# Patient Record
Sex: Female | Born: 1950 | Race: White | Hispanic: No | Marital: Married | Smoking: Never smoker
Health system: Southern US, Community
[De-identification: ages and names within clinical notes are randomized; demographics above are authoritative.]

## PROBLEM LIST (undated history)

## (undated) DIAGNOSIS — K738 Other chronic hepatitis, not elsewhere classified: Secondary | ICD-10-CM

## (undated) DIAGNOSIS — Z8781 Personal history of (healed) traumatic fracture: Secondary | ICD-10-CM

## (undated) DIAGNOSIS — D580 Hereditary spherocytosis: Secondary | ICD-10-CM

## (undated) DIAGNOSIS — K8020 Calculus of gallbladder without cholecystitis without obstruction: Secondary | ICD-10-CM

## (undated) DIAGNOSIS — K759 Inflammatory liver disease, unspecified: Secondary | ICD-10-CM

## (undated) DIAGNOSIS — S7291XA Unspecified fracture of right femur, initial encounter for closed fracture: Secondary | ICD-10-CM

## (undated) DIAGNOSIS — K115 Sialolithiasis: Secondary | ICD-10-CM

## (undated) HISTORY — PX: CHOLECYSTECTOMY: SHX55

## (undated) HISTORY — DX: Personal history of (healed) traumatic fracture: Z87.81

## (undated) HISTORY — DX: Unspecified fracture of right femur, initial encounter for closed fracture: S72.91XA

## (undated) HISTORY — DX: Sialolithiasis: K11.5

## (undated) HISTORY — PX: JOINT REPLACEMENT: SHX530

## (undated) HISTORY — DX: Inflammatory liver disease, unspecified: K75.9

## (undated) HISTORY — PX: OTHER SURGICAL HISTORY: SHX169

## (undated) HISTORY — PX: INTRAOPERATIVE CHOLANGIOGRAM: SHX5230

## (undated) HISTORY — DX: Hereditary spherocytosis: D58.0

## (undated) HISTORY — DX: Calculus of gallbladder without cholecystitis without obstruction: K80.20

## (undated) HISTORY — PX: SPHINCTEROTOMY: SHX5279

## (undated) HISTORY — DX: Other chronic hepatitis, not elsewhere classified: K73.8

---

## 2004-12-05 ENCOUNTER — Ambulatory Visit: Payer: Self-pay | Admitting: Physical Medicine & Rehabilitation

## 2004-12-05 ENCOUNTER — Inpatient Hospital Stay (HOSPITAL_COMMUNITY): Admission: RE | Admit: 2004-12-05 | Discharge: 2004-12-11 | Payer: Self-pay | Admitting: Orthopedic Surgery

## 2005-02-14 ENCOUNTER — Ambulatory Visit: Payer: Self-pay | Admitting: Family Medicine

## 2005-02-23 ENCOUNTER — Ambulatory Visit: Payer: Self-pay | Admitting: Family Medicine

## 2005-03-09 ENCOUNTER — Ambulatory Visit: Payer: Self-pay | Admitting: Family Medicine

## 2005-03-14 ENCOUNTER — Ambulatory Visit: Payer: Self-pay | Admitting: Family Medicine

## 2005-03-26 ENCOUNTER — Ambulatory Visit: Payer: Self-pay | Admitting: Family Medicine

## 2005-03-27 ENCOUNTER — Encounter: Admission: RE | Admit: 2005-03-27 | Discharge: 2005-05-13 | Payer: Self-pay | Admitting: Family Medicine

## 2005-10-23 ENCOUNTER — Ambulatory Visit: Payer: Self-pay | Admitting: Family Medicine

## 2005-11-20 ENCOUNTER — Ambulatory Visit: Payer: Self-pay | Admitting: Family Medicine

## 2005-12-04 ENCOUNTER — Ambulatory Visit: Payer: Self-pay | Admitting: Family Medicine

## 2006-01-01 ENCOUNTER — Ambulatory Visit: Payer: Self-pay | Admitting: Family Medicine

## 2006-01-29 ENCOUNTER — Ambulatory Visit: Payer: Self-pay | Admitting: Family Medicine

## 2006-02-19 DIAGNOSIS — E039 Hypothyroidism, unspecified: Secondary | ICD-10-CM

## 2006-02-19 DIAGNOSIS — E669 Obesity, unspecified: Secondary | ICD-10-CM | POA: Insufficient documentation

## 2006-02-19 DIAGNOSIS — D649 Anemia, unspecified: Secondary | ICD-10-CM | POA: Insufficient documentation

## 2006-02-19 DIAGNOSIS — E739 Lactose intolerance, unspecified: Secondary | ICD-10-CM

## 2006-02-19 DIAGNOSIS — N951 Menopausal and female climacteric states: Secondary | ICD-10-CM

## 2006-02-19 DIAGNOSIS — M810 Age-related osteoporosis without current pathological fracture: Secondary | ICD-10-CM | POA: Insufficient documentation

## 2006-02-19 DIAGNOSIS — K219 Gastro-esophageal reflux disease without esophagitis: Secondary | ICD-10-CM | POA: Insufficient documentation

## 2006-02-19 DIAGNOSIS — G43909 Migraine, unspecified, not intractable, without status migrainosus: Secondary | ICD-10-CM | POA: Insufficient documentation

## 2006-09-06 ENCOUNTER — Encounter: Payer: Self-pay | Admitting: Family Medicine

## 2006-09-27 ENCOUNTER — Ambulatory Visit: Payer: Self-pay | Admitting: Family Medicine

## 2006-09-27 DIAGNOSIS — B354 Tinea corporis: Secondary | ICD-10-CM | POA: Insufficient documentation

## 2006-09-27 DIAGNOSIS — R7301 Impaired fasting glucose: Secondary | ICD-10-CM | POA: Insufficient documentation

## 2006-09-27 DIAGNOSIS — J309 Allergic rhinitis, unspecified: Secondary | ICD-10-CM | POA: Insufficient documentation

## 2006-09-27 LAB — CONVERTED CEMR LAB

## 2006-10-03 ENCOUNTER — Encounter: Payer: Self-pay | Admitting: Family Medicine

## 2006-10-06 ENCOUNTER — Encounter: Payer: Self-pay | Admitting: Family Medicine

## 2006-10-24 ENCOUNTER — Ambulatory Visit: Payer: Self-pay | Admitting: Family Medicine

## 2006-10-24 DIAGNOSIS — R21 Rash and other nonspecific skin eruption: Secondary | ICD-10-CM

## 2006-10-30 ENCOUNTER — Telehealth: Payer: Self-pay | Admitting: Family Medicine

## 2006-11-04 ENCOUNTER — Ambulatory Visit: Payer: Self-pay | Admitting: Family Medicine

## 2006-11-04 DIAGNOSIS — L299 Pruritus, unspecified: Secondary | ICD-10-CM | POA: Insufficient documentation

## 2006-11-05 ENCOUNTER — Encounter: Payer: Self-pay | Admitting: Family Medicine

## 2006-11-05 LAB — CONVERTED CEMR LAB
Basophils Absolute: 0.1 10*3/uL (ref 0.0–0.1)
Basophils Relative: 1 % (ref 0–1)
Eosinophils Absolute: 0.4 10*3/uL (ref 0.0–0.7)
Eosinophils Relative: 3 % (ref 0–5)
Monocytes Relative: 17 % — ABNORMAL HIGH (ref 3–11)
Platelets: 599 10*3/uL — ABNORMAL HIGH (ref 150–400)
RBC: 3.18 M/uL — ABNORMAL LOW (ref 3.87–5.11)
WBC: 11.8 10*3/uL — ABNORMAL HIGH (ref 4.0–10.5)

## 2006-11-18 ENCOUNTER — Ambulatory Visit: Payer: Self-pay | Admitting: Family Medicine

## 2006-11-18 DIAGNOSIS — D599 Acquired hemolytic anemia, unspecified: Secondary | ICD-10-CM | POA: Insufficient documentation

## 2006-11-18 LAB — CONVERTED CEMR LAB: Hemoglobin: 9.4 g/dL

## 2006-11-25 ENCOUNTER — Telehealth: Payer: Self-pay | Admitting: Family Medicine

## 2006-12-15 ENCOUNTER — Encounter: Payer: Self-pay | Admitting: Family Medicine

## 2006-12-22 ENCOUNTER — Encounter: Payer: Self-pay | Admitting: Family Medicine

## 2006-12-23 ENCOUNTER — Telehealth: Payer: Self-pay | Admitting: Family Medicine

## 2006-12-25 ENCOUNTER — Encounter: Payer: Self-pay | Admitting: Family Medicine

## 2007-01-23 ENCOUNTER — Encounter: Payer: Self-pay | Admitting: Family Medicine

## 2007-02-13 ENCOUNTER — Encounter: Payer: Self-pay | Admitting: Family Medicine

## 2007-02-13 LAB — CONVERTED CEMR LAB
AST: 33 units/L
Alkaline Phosphatase: 126 units/L
Sodium: 135 meq/L

## 2007-02-18 ENCOUNTER — Encounter: Payer: Self-pay | Admitting: Family Medicine

## 2007-03-03 ENCOUNTER — Encounter: Payer: Self-pay | Admitting: Family Medicine

## 2007-07-13 HISTORY — PX: VERTEBROPLASTY: SHX113

## 2007-09-19 ENCOUNTER — Encounter: Payer: Self-pay | Admitting: Family Medicine

## 2007-11-01 ENCOUNTER — Encounter: Payer: Self-pay | Admitting: Family Medicine

## 2007-12-01 ENCOUNTER — Ambulatory Visit: Payer: Self-pay | Admitting: Family Medicine

## 2007-12-01 DIAGNOSIS — L290 Pruritus ani: Secondary | ICD-10-CM

## 2007-12-01 DIAGNOSIS — M818 Other osteoporosis without current pathological fracture: Secondary | ICD-10-CM | POA: Insufficient documentation

## 2007-12-04 ENCOUNTER — Encounter: Payer: Self-pay | Admitting: Family Medicine

## 2007-12-19 ENCOUNTER — Ambulatory Visit: Payer: Self-pay | Admitting: Family Medicine

## 2007-12-22 ENCOUNTER — Encounter: Payer: Self-pay | Admitting: Family Medicine

## 2007-12-23 LAB — CONVERTED CEMR LAB
Cholesterol: 105 mg/dL (ref 0–200)
Glucose, Bld: 107 mg/dL — ABNORMAL HIGH (ref 70–99)
HDL: 34 mg/dL — ABNORMAL LOW (ref >39)
Total CHOL/HDL Ratio: 3.1
Triglycerides: 178 mg/dL — ABNORMAL HIGH (ref <150)

## 2008-07-30 ENCOUNTER — Encounter: Payer: Self-pay | Admitting: Family Medicine

## 2009-11-30 ENCOUNTER — Ambulatory Visit: Payer: Self-pay | Admitting: Family Medicine

## 2009-11-30 DIAGNOSIS — J0100 Acute maxillary sinusitis, unspecified: Secondary | ICD-10-CM

## 2009-12-08 ENCOUNTER — Ambulatory Visit: Payer: Self-pay | Admitting: Family Medicine

## 2009-12-08 DIAGNOSIS — I959 Hypotension, unspecified: Secondary | ICD-10-CM | POA: Insufficient documentation

## 2009-12-30 ENCOUNTER — Ambulatory Visit: Payer: Self-pay | Admitting: Family Medicine

## 2009-12-30 DIAGNOSIS — B9789 Other viral agents as the cause of diseases classified elsewhere: Secondary | ICD-10-CM

## 2010-01-05 ENCOUNTER — Encounter: Payer: Self-pay | Admitting: Family Medicine

## 2010-01-06 ENCOUNTER — Encounter: Admission: RE | Admit: 2010-01-06 | Discharge: 2010-01-06 | Payer: Self-pay | Admitting: Orthopedic Surgery

## 2010-02-24 ENCOUNTER — Ambulatory Visit: Payer: Self-pay | Admitting: Family Medicine

## 2010-02-24 DIAGNOSIS — L989 Disorder of the skin and subcutaneous tissue, unspecified: Secondary | ICD-10-CM | POA: Insufficient documentation

## 2010-02-27 LAB — CONVERTED CEMR LAB
Albumin: 4.3 g/dL (ref 3.5–5.2)
Alkaline Phosphatase: 92 units/L (ref 39–117)
Chloride: 104 meq/L (ref 96–112)
Creatinine, Ser: 0.72 mg/dL (ref 0.40–1.20)
Eosinophils Absolute: 0.6 10*3/uL (ref 0.0–0.7)
HCT: 31 % — ABNORMAL LOW (ref 36.0–46.0)
Hemoglobin: 9.9 g/dL — ABNORMAL LOW (ref 12.0–15.0)
Lymphocytes Relative: 27 % (ref 12–46)
Lymphs Abs: 3.5 10*3/uL (ref 0.7–4.0)
MCHC: 31.9 g/dL (ref 30.0–36.0)
MCV: 90.6 fL (ref 78.0–100.0)
Monocytes Absolute: 1.9 10*3/uL — ABNORMAL HIGH (ref 0.1–1.0)
Neutro Abs: 6.7 10*3/uL (ref 1.7–7.7)
Neutrophils Relative %: 52 % (ref 43–77)
Platelets: 429 10*3/uL — ABNORMAL HIGH (ref 150–400)
Sodium: 140 meq/L (ref 135–145)
Total Bilirubin: 5.5 mg/dL — ABNORMAL HIGH (ref 0.3–1.2)

## 2010-03-02 ENCOUNTER — Ambulatory Visit: Payer: Self-pay | Admitting: Family Medicine

## 2010-03-02 DIAGNOSIS — E538 Deficiency of other specified B group vitamins: Secondary | ICD-10-CM

## 2010-03-07 ENCOUNTER — Encounter: Payer: Self-pay | Admitting: Family Medicine

## 2010-03-07 DIAGNOSIS — D510 Vitamin B12 deficiency anemia due to intrinsic factor deficiency: Secondary | ICD-10-CM

## 2010-03-16 ENCOUNTER — Ambulatory Visit: Payer: Self-pay | Admitting: Family Medicine

## 2010-03-16 DIAGNOSIS — J069 Acute upper respiratory infection, unspecified: Secondary | ICD-10-CM | POA: Insufficient documentation

## 2010-06-13 NOTE — Miscellaneous (Signed)
  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  

## 2010-06-13 NOTE — Assessment & Plan Note (Signed)
Summary: B12 shot - jr  Nurse Visit   Allergies: 1)  ! Sulfa 2)  ! * Daravette 3)  ! * Cetacaine  Medication Administration  Injection # 1:    Medication: Vit B12 1000 mcg    Diagnosis: ANEMIA, HEMOLYTIC (ICD-283.9)    Route: IM    Site: L deltoid    Exp Date: 09/2011    Lot #: [id number]    Patient tolerated injection without complications    Given by: [name] (March 02, 2010 10:22 AM)  Orders Added: 1)  Vit B12 1000 mcg [J3420] 2)  Admin of Therapeutic Inj  intramuscular or subcutaneous [96372]   Medication Administration  Injection # 1:    Medication: Vit B12 1000 mcg    Diagnosis: ANEMIA, HEMOLYTIC (ICD-283.9)    Route: IM    Site: L deltoid    Exp Date: 09/2011    Lot #: [id number]    Patient tolerated injection without complications    Given by: [name] (March 02, 2010 10:22 AM)  Orders Added: 1)  Vit B12 1000 mcg [J3420] 2)  Admin of Therapeutic Inj  intramuscular or subcutaneous [[id number]]

## 2010-06-13 NOTE — Assessment & Plan Note (Signed)
Summary: URI/ jaundice   Vital Signs:  Patient profile:   60 year old female Height:      65 inches Weight:      220 pounds BMI:     36.74 O2 Sat:      96 % on Room air Temp:     99.2 degrees F oral Pulse rate:   74 / minute BP sitting:   120 / 42  (left arm) Cuff size:   large  Vitals Entered By: [name] (March 16, 2010 10:04 AM)  O2 Flow:  Room air CC: Sinus and allergy issues. Productive cough x 4 days.    Primary Care Provider:  Seymour Bars, DO  CC:  Sinus and allergy issues. Productive cough x 4 days. Marland Kitchen  History of Present Illness: 60 yo WF presents for sore throat and postnasal drip x 3 days.  She has had more chest congestion and cough since yesterday.  Denies chest tightness or SOB.  Has clear rhinorrhea and mild sinus pressure.  Cough is a little productive.  No fevers, chills, GI upset or rash.  No sick contacts.  Had ear pressure and a little HA.  Denies malaise or bodyaches.    She has been taking Zyrtec but it doesn't seem to be helping.  Her cough is not keeping her up at night.  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  Everytime she gets sick, she gets jaundiced and has noticed that her eye have started to turn yellow.       Allergies: 1)  ! Sulfa 2)  ! * Daravette 3)  ! * Cetacaine  Past History:  Past Medical History: Reviewed history from 12/01/2007 and no changes required. bilirubin stones  recurrent hepatitis finished HRT in 2006, G0  gall stones  hgb baseline 10  R femur fracture  sialolithiasis  spherocytosis w/ hemolytic anemia severe osteoporosis vertebral compression fractures  Social History: Reviewed history from 02/19/2006 and no changes required. Retired, married to [place].  Has 2 stepchildren.  Nonsmoker.  Does not exersice.  Wants to lose wt.  Spends winters in [place].  Review of Systems      See HPI  Physical Exam  General:  alert, well-developed, well-nourished, and well-hydrated.   obese Head:  normocephalic and atraumatic.   Eyes:  mild scleral icterus, wears glasses Nose:  External nasal examination shows no deformity or inflammation. Nasal mucosa are pink and moist without lesions or exudates. Mouth:  pharynx pink and moist.   Neck:  no masses.   Lungs:  Normal respiratory effort, chest expands symmetrically. Lungs are clear to auscultation, no crackles or wheezes. dry cough Heart:  Normal rate and regular rhythm. S1 and S2 normal without gallop, murmur, click, rub or other extra sounds. Skin:  slight jaundice; no rash Cervical Nodes:  shotty submandibular LA   Impression & Recommendations:  Problem # 1:  VIRAL URI (ICD-465.9)  Instructed on symptomatic treatment. Call if symptoms persist or worsen.  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 OK to use OTC Robitussin.  Call if any problems. Leaving for winter in [place] in 2 wks.  Has a doctor there to f/u with.  Complete Medication List: 1)  Atenolol 50 Mg Tabs (Atenolol) .... Take 1/2 tablet daily by mouth for migraines 2)  Calcium-vitamin D 250-125 Mg-unit Tabs (Calcium carbonate-vitamin d) .... Take one tablet by mouth once  a day 3)  Folic Acid Tabs (Folic acid tabs) .... Take 2 tablets by mouth once a day 4)  Levothroid 200 Mcg Tabs (Levothyroxine sodium) .... Take one tablet by  mouth once aday 5)  Omeprazole 20 Mg Cpdr (Omeprazole) .Marland Kitchen.. 1 tab by mouth daily 6)  Accucheck Compact Plus Test Strips and Lancets  .... Check sugar once daily 7)  Reclast 5 Mg/147ml Soln (Zoledronic acid) .... Use as directed annually 8)  Vitamin D 2000 Iu  .... Take 1 tab by mouth once daily 9)  1st Choice Lancets Thin Misc (Lancets) .... Use daily as directed 10)  Accu-check Compact Plus Test Strips  .... Use once a wk as directed 11)  Tramadol Hcl 50 Mg Tabs (Tramadol hcl) .... Take 2 tabs by mouth three times a day as needed 12)   Bactroban 2 % Oint (Mupirocin) .... Apply to skin wound three times a day x 10 days 13)  Amoxicillin 875 Mg Tabs (Amoxicillin) .Marland Kitchen.. 1 tab by mouth two times a day  Patient Instructions: 1)  Take 10 days of Amoxicillin for respiratory infection. 2)  Supportive care 3)  Clear fluids. 4)  Robittussin as needed. Prescriptions: AMOXICILLIN 875 MG TABS (AMOXICILLIN) 1 tab by mouth two times a day  #20 x 0   Entered and Authorized by:   Seymour Bars DO   Signed by:   Seymour Bars DO on 03/16/2010   Method used:   Electronically to        [facility]* (retail)       [address] [place], [place]  [address]       Ph: [phone]       Fax: [phone]   RxID:   [id number]    Orders Added: 1)  Est. Patient Level III [84132]

## 2010-06-13 NOTE — Letter (Signed)
Summary: [facility]   Imported By: Lanelle Bal 01/19/2010 08:56:18  _____________________________________________________________________  External Attachment:    Type:   Image     Comment:   External Document

## 2010-06-13 NOTE — Assessment & Plan Note (Signed)
Summary: sinsutisi   Vital Signs:  Patient profile:   60 year old female Height:      65 inches Weight:      220 pounds BMI:     36.74 O2 Sat:      96 % on Room air Temp:     98.6 degrees F oral Pulse rate:   59 / minute BP sitting:   103 / 39  (left arm) Cuff size:   large  Vitals Entered By: [name] (November 30, 2009 11:31 AM)  O2 Flow:  Room air CC: HA x 3 days, L ear pain x 2 days and sinus pressure and drainage x 1 day.   Primary Care Provider:  Seymour Bars, DO  CC:  HA x 3 days and L ear pain x 2 days and sinus pressure and drainage x 1 day.Marland Kitchen  History of Present Illness: 60 yo WF presents with a HA x 4 days, L sinus pressure and L ear pain x 2 days.  She has moved to [facility] but is visiting her mom and daughter here.  She has had some runny nose and nasal congestion.  She took some Zyrtec and excedrin.  She has pressure behind her L eye.  Denies any fevers or chills.  Has a mild sore throat and a little dry cough.  Feels run down.    She has hereditary spherocytosis and every time she gets a bad infection/ fever, she gets jaundiced.  This has not yet happened.   Current Medications (verified): 1)  Atenolol 50 Mg Tabs (Atenolol) .... Take 1/2 Tablet Daily By Mouth For Migraines 2)  Calcium-Vitamin D 250-125 Mg-Unit Tabs (Calcium Carbonate-Vitamin D) .... Take One Tablet By Mouth Once A Day 3)  Folic Acid  Tabs (Folic Acid Tabs) .... Take 2 Tablets By Mouth Once A Day 4)  Levothroid 200 Mcg Tabs (Levothyroxine Sodium) .... Take One Tablet By  Mouth Once Aday 5)  Omeprazole 20 Mg Cpdr (Omeprazole) .Marland Kitchen.. 1 Tab By Mouth Daily 6)  Accucheck Compact Plus Test Strips and Lancets .... Check Sugar Once Daily 7)  Reclast 5 Mg/162ml  Soln (Zoledronic Acid) .... Use As Directed Annually 8)  Vitamin D Oct 09, 1998 Iu .... Take 1 Tab By Mouth Once Daily 9)  1st Choice Lancets Thin   Misc (Lancets) .... Use Daily As Directed 10)  Accu-Check Compact Plus Test Strips .... Use Once A Wk As  Directed 11)  Tramadol Hcl 50 Mg Tabs (Tramadol Hcl) .... Take 2 Tabs By Mouth Three Times A Day As Needed  Allergies (verified): 1)  ! Sulfa 2)  ! * Daravette 3)  ! * Cetacaine  Past History:  Past Medical History: Reviewed history from 12/01/2007 and no changes required. bilirubin stones  recurrent hepatitis finished HRT in 08-Oct-2004, G0  gall stones  hgb baseline 10  R femur fracture  sialolithiasis  spherocytosis w/ hemolytic anemia severe osteoporosis vertebral compression fractures  Past Surgical History: Reviewed history from 12/01/2007 and no changes required. 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 Brother- IBS, high TGs, father- AMI, Liver ca (died in Oct 08, 1992), mother- arthritis, migraines, DM, highTGs  Social History: Reviewed history from 02/19/2006 and no changes required.  Retired, married to [place].  Has 2 stepchildren.  Nonsmoker.  Does not exersice.  Wants to lose wt.  Spends winters in [place].  Review of Systems      See HPI  Physical Exam  General:  alert, well-developed, well-nourished, and well-hydrated.  obese Head:  normocephalic and atraumatic.  L maxillary sinus TTP Eyes:  sclera non icteric; conjunctiva clear Ears:  EACs patent; TMs translucent and gray with good cone of light and bony landmarks.  Nose:  nasal congestion present Mouth:  pharynx pink and moist.   Neck:  no masses.   Lungs:  Normal respiratory effort, chest expands symmetrically. Lungs are clear to auscultation, no crackles or wheezes. Heart:  normal rate, regular rhythm, and no murmur.   Extremities:  no LE edema Skin:  color normal.  no pallor or jaundice Cervical Nodes:  No  lymphadenopathy noted Psych:  good eye contact, not anxious appearing, and not depressed appearing.     Impression & Recommendations:  Problem # 1:  ACUTE MAXILLARY SINUSITIS (ICD-461.0) Likely the cause for her L sided Head pressure and L sided ear ache.  Will treat her iwth zithromax x 5 days.  Use Advil Cold and sinus for symptom relief.  Call if not improved in 1 wk.  RTC to recheck BP given low DBP in 59 wks.  Asymptomatic.  Drink plenty of fluids, rest for supportive care. The following medications were removed from the medication list:    Allegra-d 12 Hour 60-120 Mg Tb12 (Fexofenadine-pseudoephedrine) .Marland Kitchen... 1 tab by mouth two times a day as needed Her updated medication list for this problem includes:    Zithromax Z-pak 250 Mg Tabs (Azithromycin) .Marland Kitchen... 2 tabs by mouth x 1 day then 1 tab by mouth daily x 4 days  Complete Medication List: 1)  Atenolol 50 Mg Tabs (Atenolol) .... Take 1/2 tablet daily by mouth for migraines 2)  Calcium-vitamin D 250-125 Mg-unit Tabs (Calcium carbonate-vitamin d) .... Take one tablet by mouth once a day 3)  Folic Acid Tabs (Folic acid tabs) .... Take 2 tablets by mouth once a day 4)  Levothroid 200 Mcg Tabs (Levothyroxine sodium) .... Take one tablet by  mouth once aday 5)  Omeprazole 20 Mg Cpdr (Omeprazole) .Marland Kitchen.. 1 tab by mouth daily 6)  Accucheck Compact Plus Test Strips and Lancets  .... Check sugar once daily 7)  Reclast 5 Mg/179ml Soln (Zoledronic acid) .... Use as directed annually 8)  Vitamin D 2000 Iu  .... Take 1 tab by mouth once daily 9)  1st Choice Lancets Thin Misc (Lancets) .... Use daily as directed 10)  Accu-check Compact Plus Test Strips  .... Use once a wk as directed 11)  Tramadol Hcl 50 Mg Tabs (Tramadol hcl) .... Take 2 tabs by mouth three times a day as needed 12)  Zithromax Z-pak 250 Mg Tabs (Azithromycin) .... 2 tabs by mouth x 1 day then 1 tab by mouth daily x 4 days  Patient Instructions: 1)  Take 5 days of Zithromax for  sinusitis. 2)  Take Advil Cold and Sinus for symptom relief. 3)  Drink plenty of fluids, rest. 4)  Return to recheck BP in 59 wks. Prescriptions: ZITHROMAX Z-PAK 250 MG TABS (AZITHROMYCIN) 2 tabs by mouth x 1 day then 1 tab by mouth daily x 4 days  #1 pack x 0   Entered and Authorized by:   Seymour Bars DO   Signed by:   Seymour Bars DO on 11/30/2009   Method used:  Electronically to        [facility]* (retail)       [address] [place], [place]  [address]       Ph: [phone]       Fax: [phone]   RxID:   [id number]

## 2010-06-13 NOTE — Assessment & Plan Note (Signed)
Summary: stitch/ anemia   Vital Signs:  Patient profile:   60 year old female Height:      65 inches Weight:      218 pounds BMI:     36.41 O2 Sat:      94 % on Room air Pulse rate:   78 / minute BP sitting:   119 / 42  (left arm) Cuff size:   large  Vitals Entered By: [name] (February 24, 2010 2:59 PM)  O2 Flow:  Room air CC: ? Infected stitch on belly.    Primary Care Provider:  Seymour Bars, DO  CC:  ? Infected stitch on belly. Marland Kitchen  History of Present Illness: 60 yo WF presents for an irritated suture from an old surgical incision over her epigastrium.  For about a wk, it has looked red and is a little itchy but no painful.  Draining some clear/yellow fluid.  Denies bleeding.  Denies fevers or chills but has fatigue.    Current Medications (verified): 1)  Atenolol 50 Mg Tabs (Atenolol) .... Take 1/2 Tablet Daily By Mouth For Migraines 2)  Calcium-Vitamin D 250-125 Mg-Unit Tabs (Calcium Carbonate-Vitamin D) .... Take One Tablet By Mouth Once A Day 3)  Folic Acid  Tabs (Folic Acid Tabs) .... Take 2 Tablets By Mouth Once A Day 4)  Levothroid 200 Mcg Tabs (Levothyroxine Sodium) .... Take One Tablet By  Mouth Once Aday 5)  Omeprazole 20 Mg Cpdr (Omeprazole) .Marland Kitchen.. 1 Tab By Mouth Daily 6)  Accucheck Compact Plus Test Strips and Lancets .... Check Sugar Once Daily 7)  Reclast 5 Mg/136ml  Soln (Zoledronic Acid) .... Use As Directed Annually 8)  Vitamin D 2000 Iu .... Take 1 Tab By Mouth Once Daily 9)  1st Choice Lancets Thin   Misc (Lancets) .... Use Daily As Directed 10)  Accu-Check Compact Plus Test Strips .... Use Once A Wk As Directed 11)  Tramadol Hcl 50 Mg Tabs (Tramadol Hcl) .... Take 2 Tabs By Mouth Three Times A Day As Needed  Allergies (verified): 1)  ! Sulfa 2)  ! * Daravette 3)  ! * Cetacaine  Past History:  Past Medical History: Reviewed history from 12/01/2007 and no changes required. bilirubin stones  recurrent hepatitis finished HRT in 2006, G0  gall stones  hgb baseline 10  R femur fracture  sialolithiasis  spherocytosis w/ hemolytic anemia severe osteoporosis vertebral compression fractures  Past Surgical History: Reviewed history from 12/01/2007 and no changes required. 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 Retired, married to [place].  Has 2 stepchildren.  Nonsmoker.  Does not exersice.  Wants to lose wt.  Spends winters in [place].  Review of Systems      See HPI  Physical Exam  General:  alert, well-developed, well-nourished, well-hydrated, and overweight-appearing.   Head:  normocephalic and atraumatic.   Eyes:  sclera non icteric Mouth:  pharynx pink and moist.   Skin:  irritated subcutaneous suture over epigastrium with localized hyperemia, no visible drainage, bleeding or edema.  No tracking with sterile swab  pale appearing   Impression & Recommendations:  Problem # 1:  SKIN LESION (ICD-709.9) Skin irritation from subcutaneous suture that is coming to  the surface.  Localized hyperemia but not an abscess or cellulitis. Will treat iwth Hydrogen Peroxide 3 x a day with topical Bactroban ointment and keep covered during the day. Avoid scratching. Call if looking any worse.  Problem # 2:  ANEMIA, OTHER, UNSPECIFIED (ICD-285.9) Hgb 8.9 with hx of hemolytic anemia, baseline of 10.  DBP a little low.  REst and hydrate this w/e.  Get additional labs today and f/u results on Monday. Her updated medication list for this problem includes:    Folic Acid Tabs (Folic acid tabs) .Marland Kitchen... Take 2 tablets by mouth once a day  Orders: T-Iron [phone]) T-Iron Binding Capacity (TIBC) ([id number]) T-Vitamin B12  [phone]) Fingerstick (36416) Hgb ([address])  Complete Medication List: 1)  Atenolol 50 Mg Tabs (Atenolol) .... Take 1/2 tablet daily by mouth for migraines 2)  Calcium-vitamin D 250-125 Mg-unit Tabs (Calcium carbonate-vitamin d) .... Take one tablet by mouth once a day 3)  Folic Acid Tabs (Folic acid tabs) .... Take 2 tablets by mouth once a day 4)  Levothroid 200 Mcg Tabs (Levothyroxine sodium) .... Take one tablet by  mouth once aday 5)  Omeprazole 20 Mg Cpdr (Omeprazole) .Marland Kitchen.. 1 tab by mouth daily 6)  Accucheck Compact Plus Test Strips and Lancets  .... Check sugar once daily 7)  Reclast 5 Mg/118ml Soln (Zoledronic acid) .... Use as directed annually 8)  Vitamin D 2000 Iu  .... Take 1 tab by mouth once daily 9)  1st Choice Lancets Thin Misc (Lancets) .... Use daily as directed 10)  Accu-check Compact Plus Test Strips  .... Use once a wk as directed 11)  Tramadol Hcl 50 Mg Tabs (Tramadol hcl) .... Take 2 tabs by mouth three times a day as needed 12)  Bactroban 2 % Oint (Mupirocin) .... Apply to skin wound three times a day x 10 days  Other Orders: T-CBC w/Diff ([id number]) T-Comprehensive Metabolic Panel ([id number]) T-TSH ([id number])  Patient Instructions: 1)  Clean stitch redness with hydrogen peroxide on a clean Q - tip 3 x a day.  Cover with Bactroban OIntment and a bandage during the day.  Call if any spreading redness or fevers occurs. 2)  Labs downstairs today for Hgb 8.9 (10 is your baseline). 3)  Will call you w/ results on Monday. 4)  Hydrate and rest this weekend.  Prescriptions: BACTROBAN 2 % OINT (MUPIROCIN) apply to skin wound three times a day x 10 days  #22 g x 0   Entered and Authorized by:   Seymour Bars DO   Signed by:   Seymour Bars DO on 02/24/2010   Method used:   Electronically to        [facility]* (retail)       [address] [place], [place]  [address]       Ph: [phone]       Fax: [phone]   RxID:   [id number]   Laboratory  Results   Blood Tests   Date/Time Received: 02/24/10 Date/Time Reported: 02/25/99    CBC   HGB:  8.9 g/dL   (Normal Range: 23.7-62.8 in Males, 12.0-15.0 in Females)

## 2010-06-13 NOTE — Assessment & Plan Note (Signed)
Summary: viral infection   Vital Signs:  Patient profile:   60 year old female Height:      65 inches Weight:      223 pounds BMI:     37.24 O2 Sat:      97 % on Room air Temp:     98.7 degrees F oral Pulse rate:   76 / minute BP sitting:   127 / 50  (left arm) Cuff size:   large  Vitals Entered By: [name] (December 30, 2009 11:09 AM)  O2 Flow:  Room air CC: ST, HA and sores in mouth x this AM.   Primary Care [name]:  Seymour Bars, DO  CC:  ST and HA and sores in mouth x this AM..  History of Present Illness: 60 yo WF presents for a sore throat that she woke up with this morning.  Felt hot.  Has sores in her mouth today.  Has a dry cough.  No runny nose.  Denies GI upset or rash.  Denies sick contacts but she has been in and out of the hospital visiting her mom who has been ill.    Denies tea colored urine.  She has not taken anything for her symptoms today.    Allergies: 1)  ! Sulfa 2)  ! * Daravette 3)  ! * Cetacaine  Past History:  Past Medical History: Reviewed history from 12/01/2007 and no changes required. bilirubin stones  recurrent hepatitis finished HRT in 2006, G0  gall stones  hgb baseline 10  R femur fracture  sialolithiasis  spherocytosis w/ hemolytic anemia severe osteoporosis vertebral compression fractures  Past Surgical History: Reviewed history from 12/01/2007 and no changes required. 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 Retired, married to [place]i [place].  Has 2 stepchildren.  Nonsmoker.  Does not exersice.  Wants to lose wt.  Spends winters in [place].  Review of Systems      See HPI  Physical Exam  General:  alert, well-developed,  well-nourished, and well-hydrated.  obese Head:  normocephalic and atraumatic.   Eyes:  sclera non icteric Nose:  no nasal discharge.   Mouth:  pharynx pink and moist.  tiny vesicular sores inner lower lip Neck:  tender shotty anterior cervical chain LA Lungs:  Normal respiratory effort, chest expands symmetrically. Lungs are clear to auscultation, no crackles or wheezes. Heart:  Normal rate and regular rhythm. S1 and S2 normal without gallop, murmur, click, rub or other extra sounds. Abdomen:  soft.   Extremities:  no LE edema Skin:  no rash, jaundice or pallor Psych:  good eye contact, not anxious appearing, and not depressed appearing.     Impression & Recommendations:  Problem # 1:  VIRAL INFECTION, ACUTE (ICD-079.99)  Likley adenovirus or coxackie virus given sore throat, malaise and vesicular mouth sores. Rapid strep neg. Treat iwth supportive care + Dukes Magic Mouthwash. Due to her spherocytosis, I gave her RX for Zithromax to start IF she develops a fever, jaundice, tea colored urine (given her hx of hemolysis and severe anemia from infection).    Orders: Rapid Strep (63016)  Complete Medication List: 1)  Atenolol 50 Mg Tabs (Atenolol) .... Take 1/2 tablet daily by  mouth for migraines 2)  Calcium-vitamin D 250-125 Mg-unit Tabs (Calcium carbonate-vitamin d) .... Take one tablet by mouth once a day 3)  Folic Acid Tabs (Folic acid tabs) .... Take 2 tablets by mouth once a day 4)  Levothroid 200 Mcg Tabs (Levothyroxine sodium) .... Take one tablet by  mouth once aday 5)  Omeprazole 20 Mg Cpdr (Omeprazole) .Marland Kitchen.. 1 tab by mouth daily 6)  Accucheck Compact Plus Test Strips and Lancets  .... Check sugar once daily 7)  Reclast 5 Mg/154ml Soln (Zoledronic acid) .... Use as directed annually 8)  Vitamin D 2000 Iu  .... Take 1 tab by mouth once daily 9)  1st Choice Lancets Thin Misc (Lancets) .... Use daily as directed 10)  Accu-check Compact Plus Test Strips  .... Use once a wk as  directed 11)  Tramadol Hcl 50 Mg Tabs (Tramadol hcl) .... Take 2 tabs by mouth three times a day as needed 12)  Zithromax Z-pak 250 Mg Tabs (Azithromycin) .... 2 tabs by mouth x 1 day then 1 tab by mouth daily x 4 days 13)  Dukes Magic Mouthwash  .Marland Kitchen.. 10cc swish and gargle qid as needed sore throat pain  Patient Instructions: 1)  Use Duke's Magic Mouthwash as needed for mouth/ sore throat pain. 2)  Use Ibuprofen as needed for aches/ pains. 3)  Drink plenty of clear fluids, rest. 4)  Start Z-Pack if FEVER occurs.  Temp > 100.4. 5)  Call if any problems. Prescriptions: DUKES MAGIC MOUTHWASH 10cc swish and gargle QID as needed sore throat pain  #150 ml x 0   Entered and Authorized by:   Seymour Bars DO   Signed by:   Seymour Bars DO on 12/30/2009   Method used:   Printed then faxed to ...       [facility]* (retail)       [address] [place], [place]  [address]       Ph: [phone]       Fax: [phone]   RxID:   [id number] ZITHROMAX Z-PAK 250 MG TABS (AZITHROMYCIN) 2 tabs by mouth x 1 day then 1 tab by mouth daily x 4 days  #1 pack x 0   Entered and Authorized by:   Seymour Bars DO   Signed by:   Seymour Bars DO on 12/30/2009   Method used:   Electronically to        [facility]* (retail)       [address] [place], [place]  [address]       Ph: [phone]       Fax: [phone]   RxID:   [id number]

## 2010-06-13 NOTE — Assessment & Plan Note (Signed)
Summary: BP CHECK//VGJ  Nurse Visit   Vital Signs:  Patient profile:   60 year old female Pulse rate:   84 / minute BP sitting:   125 / 61  (left arm) Cuff size:   large  Vitals Entered By: Kathlene November (December 08, 2009 10:53 AM) CC: BP check CC: Follow-up HTN HPI: Taking Meds?no Side Effects?no Chest Pain, SOB, Dizziness?no A/P: HTN(401.1) At Goal? If no, MD will be notified. Follow-up in--  5 minutes was spent with the pt.   Allergies: 1)  ! Sulfa 2)  ! * Daravette 3)  ! * Cetacaine  Orders Added: 1)  Est. Patient Level I [62694]    Impression & Recommendations:  Problem # 1:  HYPOTENSION (ICD-458.9) Assessment Improved  Orders: Est. Patient Level I (85462)  Complete Medication List: 1)  Atenolol 50 Mg Tabs (Atenolol) .... Take 1/2 tablet daily by mouth for migraines 2)  Calcium-vitamin D 250-125 Mg-unit Tabs (Calcium carbonate-vitamin d) .... Take one tablet by mouth once a day 3)  Folic Acid Tabs (Folic acid tabs) .... Take 2 tablets by mouth once a day 4)  Levothroid 200 Mcg Tabs (Levothyroxine sodium) .... Take one tablet by  mouth once aday 5)  Omeprazole 20 Mg Cpdr (Omeprazole) .Marland Kitchen.. 1 tab by mouth daily 6)  Accucheck Compact Plus Test Strips and Lancets  .... Check sugar once daily 7)  Reclast 5 Mg/151ml Soln (Zoledronic acid) .... Use as directed annually 8)  Vitamin D 2000 Iu  .... Take 1 tab by mouth once daily 9)  1st Choice Lancets Thin Misc (Lancets) .... Use daily as directed 10)  Accu-check Compact Plus Test Strips  .... Use once a wk as directed 11)  Tramadol Hcl 50 Mg Tabs (Tramadol hcl) .... Take 2 tabs by mouth three times a day as needed 12)  Zithromax Z-pak 250 Mg Tabs (Azithromycin) .... 2 tabs by mouth x 1 day then 1 tab by mouth daily x 4 days

## 2010-09-29 NOTE — Op Note (Signed)
Kristi Bell, SOBOTTA                  ACCOUNT NO.:  [id number]   MEDICAL RECORD NO.:  [id number]          PATIENT TYPE:  INP   LOCATION:  0001                         FACILITY:  [facility]   PHYSICIAN:  Madlyn Frankel. Charlann Boxer, M.D.  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   PROCEDURE:  Conversion of previous right hip surgery to right total hip  replacement with autologous and allograft bone graft.   SURGEON:  Madlyn Frankel. Charlann Boxer, M.D.   ASSISTANT:  Jaquelyn Bitter. Chabon, P.A.   COMPONENTS USED:  I maintained the patient's previously placed femoral stem  as it was well fixed. I did change the ball to a 32 mm plus 5 Morris taper  ball. The acetabular cell was converted from the bipolar system to a 66 mm  deep profile pinnacle revision cup with two dome screws into the ileum. I  then used a 60 mm plus 4 __________ liner with 10 degree high wall. This  corresponded to the correct fit for the 66 cup.   ANESTHESIA:  General.   DRAINS:  Drains x1.   SPECIMENS:  None.   ESTIMATED BLOOD LOSS:  None.   COMPLICATIONS:  None.   INDICATIONS FOR PROCEDURE:  Kristi Bell is a 60 year old female who presented  to the office with increasing right groin pain. She was seen and evaluated  at that time and radiographs revealed that she had acetabular protrusio from  the previous right hip surgery performed 15 years ago. There was no reports  of any early complications, no problems with infections throughout the  course. She had been afebrile. This appeared to be an aseptic failure of  this hip system.   Please note that review of her previous medical records noted her  significant osteopenia. They had apparently tried to convert her from a  previous hip fracture fixation to a total hip replacement but were unable to  get screw purchase into her ileum and then converted her to a bipolar. This  information was obtained from the previous medical chart.   I did not have clinical suspicion for infection and thus did not do workup.  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   I reviewed with [name] the risks and benefits of the procedure of  converting her to a total hip replacement given the bone quality that was  noted previously, this was all taken in effect with possible failure of this  down the road as well as inability to provide fixation and having converted  her to even further procedure.   Once this was carried out and review of basic hip precautions as she had had  before had been reviewed, consent was obtained.   DESCRIPTION OF PROCEDURE:  The patient was brought to the operative theater.  Once adequate  anesthesia and preoperative antibiotics, 1 g of Ancef, were  administered, the patient was positioned in the left lateral decubitus  position, right side up. The right lower extremity was then prepped and  draped in a sterile fashion including a prescrub. The patient's previous  incision was noted to be a [place] type incision but it seemed to be too  far anterior. Given the fact that the incision was 15 years ago, I felt  there was adequate blood supply particularly in her hip to make an incision  more posterior then normal position. There was about a 4 cm gap between the  previous incision and my incision. Sharp dissection was carried down to the  iliotibial band, previous sutures were removed. The iliotibial band was  incised in line with the incision. Sharp dissection of scar tissue was  carried down to expose the hip. At this point, an extensive amount of time  was required in dislocating the hip ball. This included due to the protrusio  and overgrowth of  the rim of the bone using ostetomes to remove some of this  rim to allow for the ball to be remove. After some time, the hip was able to  be removed. I was able to flip it around to the posterior aspect of the hip  and remove the bipolar ball. At this point I then placed based on the amount  of wear and size of the ball that was already in place the anterior column  was so far anterior that it was very difficult to impossible to put the stem  anterior. For this reason, I placed it proximal and anterior along the ileum  after preparing this area. Retractors were placed allowing for exposure of  the acetabulum.   A large curette was then used to debride the pseudocapsule within the  acetabular segment. There was noted to be acetabular protrusio but the  posterior column and anterior column were intact to palpation with no pelvic  discontinuity. We then began reaming. Reaming was used purely to debride the  acetabulum and try to allow for at least an area of punctate bleeding. Her  posterior and anterior walls were noted to be very thin with good columns  present posterior superior and posterior inferior as well as the anterior  column. I reamed up to a 65 mm reamer at which point I got good rim bite. I  then was able to prepare the bed of the acetabulum better. There was  punctate bleeding but it all appeared of an endocortices type appearance.  Once this was prepared, the final 66 deep profile cup was brought onto the  field as I had previously trialed to see where the hip center would return  to. Cancellous bone from VIT[facility] was brought onto the field and mixed with  prepared blood product. This graft was then retrofilled back into the  acetabulum and the 66 cup impacted with good initial stretch fit. Based on  the bone quality present, two cancellous screws were placed, one in the superior ileum with an excellent purchase and one posterior superior which  could provide some rotary  stability. The rim screws I did not feel were  going to be adequate at least from the area exposed either for concern for  posterior nerve encroachment or the fact that there was very little to any  bone present. Following this, a trial liner was placed. Initially, I used a  20 degree high wall liner positioned at 11 o'clock.  I used a 32 zero ball  first but felt that the 32.5 would be better. The patient had an extensive  amount of scarring and limited hip flexion and internal rotation to begin  with and this made it difficult to assess her true range of motion.   A clinical decision was made at the time to provide as much lateralization  and potential range of motion by adding the offset liner and the plus 5  ball.   At this point, we tried to restore the hip center by bringing the cup out to  normal position and using a deep profile cup, we also used a plus 4, 10  degree high wall liner and a plus 5 ball. The hip had a combined anteversion  of about 40-45 degrees. With flexion and internal rotation,  she was only  able to tolerate about 30-40 degrees without some impingement but I do not  feel it is necessary at this point based on the amount of scar tissue  present and concern for destabilizing her to excise all the scar anteriorly.  She did not have any more room to provide any more anteversion of this  system based on the fact of impingement with external rotation. There was no  shuck indicating soft tissue tension was appropriate. Given all these trial  components, the final components were brought onto the field. The trial  liner was removed and the final 60 mm plus 4 offset 10 degree high wall  liner was impacted at about the 10 o'clock position followed by placement of  the 32 plus 5 Morris taper head onto the trunion of the previously placed  stem. The hip was then reduced and stable as a trial reduction. Based on the  lateralization, some of the soft tissue that I had  initially taken down from  the proximal femur were unable to be reapproximated but were retained. The  iliotibial band was then reapproximated using #1 Ethibond, the gluteus  maximus fascia reapproximated using a #1 Vicryl. The rest of the wound was  closed in layers with staples on the skin. The hip was then cleaned, dried  and dressed sterilely with Adaptic dressing, sponges, tape. The patient was  then extubated and transferred to the recovery room in stable condition.   Note that she did have poor venous access and the EJ line was initially  placed.  We will consult for a PICC line this afternoon or tomorrow. We will  followup her hematocrit as she is chronically anemic due to a severe  cytosis. We will try to maintain a hematocrit around 30 as she presented at  58 which she states to be normal for her.   Please note too with PT/OT, we will have her up and out of bed hopefully tomorrow. She will be touch down to partial weight bearing for a good six  weeks to allow the bone graft consolidation. Hip precautions will need to be  reviewed and met based on the level of impingement.       MDO/MEDQ  D:  12/05/2004  T:  12/05/2004  Job:  [id number]

## 2010-09-29 NOTE — Discharge Summary (Signed)
Kristi Bell, Kristi Bell                  ACCOUNT NO.:  [id number]   MEDICAL RECORD NO.:  [id number]          PATIENT TYPE:  INP   LOCATION:  1516                         FACILITY:  [facility]   PHYSICIAN:  Madlyn Frankel. Charlann Boxer, M.D.  DATE OF BIRTH:  February 04, 1951   DATE OF ADMISSION:  12/05/2004  DATE OF DISCHARGE:  12/11/2004                                 DISCHARGE SUMMARY   ADMISSION DIAGNOSES:  1.  Right hip pain secondary to failed right total hip arthroplasty.  2.  Hypothyroidism.  3.  Migraines.  4.  History of bronchitis.  5.  History of gastroesophageal reflux disease.   DISCHARGE DIAGNOSES:  1.  Right hip pain status post a right total hip arthroplasty revision.  2.  Hypothyroidism.  3.  Migraines.  4.  History of bronchitis.  5.  History of gastroesophageal reflux disease.   ALLERGIES:  Patient has allergy to SULFA, DARVOCET, and CETACAINE.   DISCHARGE MEDICATIONS:  1.  Coumadin per pharmacy protocol.  2.  Vicodin one to two tablets every four to six hours p.r.n. pain.  3.  Keflex 500 mg q.i.d. x7 days.  4.  Cipro 500 mg twice a day for three days.  5.  Robaxin 500 mg q.6h. p.r.n.  6.  Colace 100 mg t.i.d. p.r.n.  7.  Prilosec 40 mg p.o. daily.  8.  Synthroid 175 mcg p.o. daily.  9.  Folic acid daily.  10. Caltrate 600 Plus D one tablet p.o. daily.  11. Estradiol 2 mg p.o. daily.  12. Ursodiol 300 mg two tablets p.o. daily.   PROCEDURE:  Patient had a conversion to a previous right hip surgery to a  right total hip replacement autologous and Allograft bone graft.  Surgeon  was Durene Romans, M.D.  Assistant was [employer], P.A.-C.  Date of  procedure was December 05, 2004.  Anesthesia was general.  One drain.  Estimated  blood loss was minimal.  Complications were none.  Perioperative antibiotics  were given.   HOSPITAL COURSE:  Patient was admitted on July 25 for the above-stated  procedure which she tolerated well and after adequate time in the  postanesthesia care unit  she was transferred up to the regular floor.  Postoperative day #1 she was feeling quite well.  We were monitoring some  postoperative anemia.  Hemoglobin was 10.2.  We were to follow that.  Her  hemoglobin continued to drop on postoperative day two and she ended up  having to have 2 units of packed red blood cells infused on December 08, 2004.  Patient was evaluated for possible admit and physical therapy was  recommending home health physical therapy.  Patient did develop a fever of  102.4 during her stay but there was no sign of ongoing infection.  Her  urinalysis was negative even though she developed a leukocytosis of 24.7.  We did monitor that over a several day period which it did plateau at about  24 and began to diminish before discharge.  Patient continued with physical  therapy to the extent that she became weightbearing on that  right side.  Did  begin Keflex for empiric therapy.  No signs of erythema but she did have  some serosanguineous from her incision site of that hip.  Patient to be  discharged home on Keflex and Cipro and to follow up closely with Dr.  Durene Romans.   DISCHARGE PLANNING:  Patient to be discharged home on December 11, 2004.   FOLLOW-UP:  Patient to follow up with Dr. Durene Romans in 7-10 days.   CONDITION ON DISCHARGE:  Stable.   DIET:  Regular.   ACTIVITY:  Weightbearing as tolerated on the right hip.      Thomas B. Durwin Nora, P.A.      Madlyn Frankel Charlann Boxer, M.D.  Electronically Signed    TBD/MEDQ  D:  12/25/2004  T:  12/25/2004  Job:  [id number]

## 2010-09-29 NOTE — H&P (Signed)
[name], Bell                  ACCOUNT NO.:  [id number]   MEDICAL RECORD NO.:  [id number]           PATIENT TYPE:   LOCATION:                                 FACILITY:   PHYSICIAN:  Madlyn Frankel. Charlann Boxer, M.D.  DATE OF BIRTH:  May 17, 1950   DATE OF ADMISSION:  11/26/2004  DATE OF DISCHARGE:                                HISTORY & PHYSICAL   REASON FOR ADMISSION:  Kristi Bell is a 60 year old female complaining about  right hip pain.   HISTORY OF PRESENT ILLNESS:  The patient has a history of right total hip  arthroplasty in 1991, now is complaining about moderate pain especially with  ambulation. She is scheduled for a right hip revision by Dr. Madlyn Frankel. Charlann Boxer  on December 05, 2004.   ALLERGIES:  SULFA, DARVOCET, and CETACAINE throat spray.   MEDICATIONS:  1.  Actigall 300 mg 2 tablets daily.  2.  Actonel 30 mg 1 tablet weekly.  3.  Caltrate 600 plus D to 600 mg 2 daily.  4.  Estradiol 2 mg 1 tablet daily.  5.  Folic acid 2 mg 1 tablet daily.  6.  Prilosec 40 mg 1 tablet daily.  7.  Synthroid 175 mcg 1 tablet daily.   FAMILY HISTORY:  Coronary artery disease, osteoarthritis, and liver cancer.   PAST MEDICAL HISTORY:  1.  Significant for migraines.  2.  Bronchitis.  3.  GERD.  4.  UTI.  5.  Hypothyroidism.  6.  Spherocytosis anemia.   REVIEW OF SYSTEMS:  Painful ambulations but increasing recently.   PHYSICAL EXAMINATION:  VITAL SIGNS:  Pulse 80, respirations 20, blood  pressure 162/88.  GENERAL:  [name] is a healthy 60 year old female in no acute distress.  Pleasant in mood and affect. Alert and oriented x3.  HEENT:  Cranial nerves II through XII grossly intact.  NECK:  Full range of motion without any tenderness.  CHEST:  Active breath sounds bilaterally with no wheezes, rhonchi, or rales.  HEART:  Regular rate and rhythm with no murmur.  ABDOMEN:  Nontender and nondistended.  EXTREMITIES:  Moderate tenderness with her right hip especially with  internal and external  rotation. She does have some mild tenderness of the  left hip with internal and external rotation but the right is worse.  SKIN:  She has no skin rashes or signs of cellulitis or erythema. She has  mild pedal edema bilaterally.   LABORATORY DATA:  X-rays showed previous right total hip arthroplasty.   IMPRESSION:  Right hip pain secondary to failed right hip arthroplasty.   PLAN:  Right total hip revision by Dr. Madlyn Frankel. Charlann Boxer on December 05, 2004.      Thomas B. Durwin Nora, P.A.      Madlyn Frankel Charlann Boxer, M.D.  Electronically Signed    TBD/MEDQ  D:  11/26/2004  T:  11/26/2004  Job:  [id number]

## 2010-12-25 ENCOUNTER — Inpatient Hospital Stay (INDEPENDENT_AMBULATORY_CARE_PROVIDER_SITE_OTHER)
Admission: RE | Admit: 2010-12-25 | Discharge: 2010-12-25 | Disposition: A | Discharge status: Home / Self Care | Payer: Medicare Other | Source: Ambulatory Visit | Attending: Family Medicine | Admitting: Family Medicine

## 2010-12-25 ENCOUNTER — Encounter: Payer: Self-pay | Admitting: Family Medicine

## 2010-12-25 ENCOUNTER — Ambulatory Visit (INDEPENDENT_AMBULATORY_CARE_PROVIDER_SITE_OTHER): Payer: Medicare Other | Admitting: Family Medicine

## 2010-12-25 VITALS — BP 122/75 | HR 68

## 2010-12-25 DIAGNOSIS — R29818 Other symptoms and signs involving the nervous system: Secondary | ICD-10-CM

## 2010-12-25 DIAGNOSIS — M5430 Sciatica, unspecified side: Secondary | ICD-10-CM

## 2010-12-25 DIAGNOSIS — E538 Deficiency of other specified B group vitamins: Secondary | ICD-10-CM

## 2010-12-25 DIAGNOSIS — M62838 Other muscle spasm: Secondary | ICD-10-CM

## 2010-12-25 MED ORDER — CYANOCOBALAMIN 1000 MCG/ML IJ SOLN
1000.0000 ug | INTRAMUSCULAR | Status: DC
  Administered 2010-12-25: 1000 ug via INTRAMUSCULAR

## 2010-12-25 NOTE — Progress Notes (Signed)
  Subjective:    Patient ID: Kristi Bell, female    DOB: 1951-02-18, 60 y.o.   MRN: [id number]  HPI B12 inj given    Review of Systems     Objective:   Physical Exam        Assessment & Plan:

## 2011-01-26 ENCOUNTER — Ambulatory Visit: Payer: Medicare Other

## 2011-01-29 ENCOUNTER — Encounter: Payer: Self-pay | Admitting: Family Medicine

## 2011-01-29 DIAGNOSIS — K8050 Calculus of bile duct without cholangitis or cholecystitis without obstruction: Secondary | ICD-10-CM | POA: Insufficient documentation

## 2011-04-16 NOTE — Progress Notes (Signed)
Summary: Left leg pain (rm 2)   Vital Signs:  Patient Profile:   60 Years Old Female CC:      left glute and leg pain x 5 days Height:     65 inches Weight:      202 pounds O2 Sat:      98 % O2 treatment:    Room Air Temp:     98.6 degrees F oral Pulse rate:   71 / minute Resp:     16 per minute BP sitting:   120 / 74  (left arm) Cuff size:   large  Pt. in pain?   yes    Location:   left glute and left leg    Intensity:   8    Type:       sharp  Vitals Entered By: Lajean Saver RN (December 25, 2010 9:02 AM)                   Prior Medication List:  ATENOLOL 50 MG TABS (ATENOLOL) Take 1/2 tablet daily by mouth for migraines CALCIUM-VITAMIN D 250-125 MG-UNIT TABS (CALCIUM CARBONATE-VITAMIN D) Take one tablet by mouth once a day FOLIC ACID  TABS (FOLIC ACID TABS) Take 2 tablets by mouth once a day LEVOTHROID 200 MCG TABS (LEVOTHYROXINE SODIUM) Take one tablet by  mouth once aday OMEPRAZOLE 20 MG CPDR (OMEPRAZOLE) 1 tab by mouth daily * ACCUCHECK COMPACT PLUS TEST STRIPS AND LANCETS check sugar once daily RECLAST 5 MG/100ML  SOLN (ZOLEDRONIC ACID) USE AS DIRECTED ANNUALLY * VITAMIN D 2000 IU Take 1 tab by mouth once daily 1ST CHOICE LANCETS THIN   MISC (LANCETS) use daily as directed * ACCU-CHECK COMPACT PLUS TEST STRIPS use once a wk as directed TRAMADOL HCL 50 MG TABS (TRAMADOL HCL) Take 2 tabs by mouth three times a day as needed BACTROBAN 2 % OINT (MUPIROCIN) apply to skin wound three times a day x 10 days AMOXICILLIN 875 MG TABS (AMOXICILLIN) 1 tab by mouth two times a day   Updated Prior Medication List: ATENOLOL 50 MG TABS (ATENOLOL) Take 1/2 tablet daily by mouth for migraines CALCIUM-VITAMIN D 250-125 MG-UNIT TABS (CALCIUM CARBONATE-VITAMIN D) Take one tablet by mouth once a day FOLIC ACID  TABS (FOLIC ACID TABS) Take 2 tablets by mouth once a day LEVOTHROID 200 MCG TABS (LEVOTHYROXINE SODIUM) Take one tablet by  mouth once aday OMEPRAZOLE 20 MG CPDR  (OMEPRAZOLE) 1 tab by mouth daily * ACCUCHECK COMPACT PLUS TEST STRIPS AND LANCETS check sugar once daily RECLAST 5 MG/100ML  SOLN (ZOLEDRONIC ACID) USE AS DIRECTED ANNUALLY * VITAMIN D 2000 IU Take 1 tab by mouth once daily 1ST CHOICE LANCETS THIN   MISC (LANCETS) use daily as directed * ACCU-CHECK COMPACT PLUS TEST STRIPS use once a wk as directed TRAMADOL HCL 50 MG TABS (TRAMADOL HCL) Take 2 tabs by mouth three times a day as needed DILTIAZEM HCL 30 MG TABS (DILTIAZEM HCL) 1 three times a day ASPIRIN 81 MG TBEC (ASPIRIN) once daily  Current Allergies (reviewed today): ! SULFA ! * DARAVETTE ! * CETACAINEHistory of Present Illness Chief Complaint: left glute and leg pain x 5 days History of Present Illness: Patient w/a of L leg pain and siatica before. She statesWednesday before they left on Thursday to drive to [facility] started having a flare up and the 2 day trip did not help. Sh has had a R hip placement and has had several medical problems this year. States she has been  taken 1-2 5/500mg  vicodin w/out much pain relief. She has used neurotin for this before but not aware of the dosing.   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) B12 DEFICIENCY (ICD-266.2) SKIN LESION (ICD-709.9) VIRAL INFECTION, ACUTE (ICD-079.99) HYPOTENSION (ICD-458.9) ACUTE MAXILLARY SINUSITIS (ICD-461.0) PRURITUS, ANAL (ICD-698.0) HEALTHY ADULT FEMALE (ICD-V70.0) OTHER OSTEOPOROSIS (ICD-733.09) SCREENING FOR LIPOID DISORDERS (ICD-V77.91) ANEMIA, HEMOLYTIC (ICD-283.9) PRURITUS (ICD-698.9) SKIN RASH (ICD-782.1) IMPAIRED FASTING GLUCOSE (ICD-790.21) ALLERGIC RHINITIS (ICD-477.9) TINEA CORPORIS (ICD-110.5) OSTEOPOROSIS, UNSPECIFIED (ICD-733.00) OBESITY, NOS (ICD-278.00) MIGRAINE, UNSPEC., W/O INTRACTABLE MIGRAINE (ICD-346.90) MENOPAUSAL SYNDROME (ICD-627.2) HYPOTHYROIDISM, UNSPECIFIED  (ICD-244.9) GLUCOSE/LACTOSE INTOLERANCE (ICD-271.3) GASTROESOPHAGEAL REFLUX, NO ESOPHAGITIS (ICD-530.81) ANEMIA, OTHER, UNSPECIFIED (ICD-285.9)   Current Meds ATENOLOL 50 MG TABS (ATENOLOL) Take 1/2 tablet daily by mouth for migraines CALCIUM-VITAMIN D 250-125 MG-UNIT TABS (CALCIUM CARBONATE-VITAMIN D) Take one tablet by mouth once a day FOLIC ACID  TABS (FOLIC ACID TABS) Take 2 tablets by mouth once a day LEVOTHROID 200 MCG TABS (LEVOTHYROXINE SODIUM) Take one tablet by  mouth once aday OMEPRAZOLE 20 MG CPDR (OMEPRAZOLE) 1 tab by mouth daily * ACCUCHECK COMPACT PLUS TEST STRIPS AND LANCETS check sugar once daily RECLAST 5 MG/100ML  SOLN (ZOLEDRONIC ACID) USE AS DIRECTED ANNUALLY * VITAMIN D 2000 IU Take 1 tab by mouth once daily 1ST CHOICE LANCETS THIN   MISC (LANCETS) use daily as directed * ACCU-CHECK COMPACT PLUS TEST STRIPS use once a wk as directed TRAMADOL HCL 50 MG TABS (TRAMADOL HCL) Take 2 tabs by mouth three times a day as needed DILTIAZEM HCL 30 MG TABS (DILTIAZEM HCL) 1 three times a day ASPIRIN 81 MG TBEC (ASPIRIN) once daily MOBIC 7.5 MG TABS (MELOXICAM) 1 by mouth q day SKELAXIN 800 MG TABS (METAXALONE) 1 by mouth q 8hrs to q 12 as needed muscle relaxant NEURONTIN 300 MG CAPS (GABAPENTIN) 1 by mouth q day at night may increase to twice a day if needed PERCOCET 5-325 MG TABS (OXYCODONE-ACETAMINOPHEN) 1 by mouth q 8hrs prn  REVIEW OF SYSTEMS Constitutional Symptoms      Denies fever, chills, night sweats, weight loss, weight gain, and fatigue.  Eyes       Complains of glasses.      Denies change in vision, eye pain, eye discharge, contact lenses, and eye surgery. Ear/Nose/Throat/Mouth       Denies hearing loss/aids, change in hearing, ear pain, ear discharge, dizziness, frequent runny nose, frequent nose bleeds, sinus problems, sore throat, hoarseness, and tooth pain or bleeding.  Respiratory       Denies dry cough, productive cough, wheezing, shortness of breath,  asthma, bronchitis, and emphysema/COPD.  Cardiovascular       Denies murmurs, chest pain, and tires easily with exhertion.    Gastrointestinal       Denies stomach pain, nausea/vomiting, diarrhea, constipation, blood in bowel movements, and indigestion. Genitourniary       Denies painful urination, blood or discharge from vagina, kidney stones, and loss of urinary control. Neurological       Denies paralysis, seizures, and fainting/blackouts. Musculoskeletal       Complains of muscle pain, joint pain, joint stiffness, and decreased range of motion.      Denies redness, swelling, muscle weakness, and gout.      Comments: left leg/glute Skin       Denies bruising, unusual mles/lumps or sores, and hair/skin or nail changes.  Psych       Denies mood changes, temper/anger issues, anxiety/stress, speech problems,  depression, and sleep problems. Other Comments: Patient c/o pain in her left glute that radiates down her left leg. She has a hx of sciatica and says this feels very similiar. Pain is worse with getting up and down   Past History:  Family History: Last updated: 02/19/2006 Brother- IBS, high TGs, father- AMI, Liver ca (died in Oct 03, 1992), mother- arthritis, migraines, DM, highTGs  Social History: Last updated: 02/19/2006 Retired, married to [place].  Has 2 stepchildren.  Nonsmoker.  Does not exersice.  Wants to lose wt.  Spends winters in [place].  Past Medical History: Reviewed history from 12/01/2007 and no changes required. bilirubin stones  recurrent hepatitis finished HRT in 10-03-04, G0  gall stones  hgb baseline 10  R femur fracture  sialolithiasis  spherocytosis w/ hemolytic anemia severe osteoporosis vertebral compression fractures  Past Surgical History: Reviewed history from 12/01/2007 and no changes required. cholangiogram,  Cholecystectomy  Hysterectomy & BSO, non- cancerous  L arm pinned  R elbow pinned  R femur pinned  R hip pinned  R hip replacement  R hip  revision  sphincterotomy- for gall stones  Splenectomy bil duct surgery 8-08 vertebroplasty 3-09  Family History: Reviewed history from 02/19/2006 and no changes required. Brother- IBS, high TGs, father- AMI, Liver ca (died in 10/03/92), mother- arthritis, migraines, DM, highTGs  Social History: Reviewed history from 02/19/2006 and no changes required. Retired, married to [place].  Has 2 stepchildren.  Nonsmoker.  Does not exersice.  Wants to lose wt.  Spends winters in [place]. 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) B12 DEFICIENCY (ICD-266.2) SKIN LESION (ICD-709.9) VIRAL INFECTION, ACUTE (ICD-079.99) HYPOTENSION (ICD-458.9) ACUTE MAXILLARY SINUSITIS (ICD-461.0) PRURITUS, ANAL (ICD-698.0) HEALTHY ADULT FEMALE (ICD-V70.0) OTHER OSTEOPOROSIS (ICD-733.09) SCREENING FOR LIPOID DISORDERS (ICD-V77.91) ANEMIA, HEMOLYTIC (ICD-283.9) PRURITUS (ICD-698.9) SKIN RASH (ICD-782.1) IMPAIRED FASTING GLUCOSE (ICD-790.21) ALLERGIC RHINITIS (ICD-477.9) TINEA CORPORIS (ICD-110.5) OSTEOPOROSIS, UNSPECIFIED (ICD-733.00) OBESITY, NOS (ICD-278.00) MIGRAINE, UNSPEC., W/O INTRACTABLE MIGRAINE (ICD-346.90) MENOPAUSAL SYNDROME (ICD-627.2) HYPOTHYROIDISM, UNSPECIFIED (ICD-244.9) GLUCOSE/LACTOSE INTOLERANCE (ICD-271.3) GASTROESOPHAGEAL REFLUX, NO ESOPHAGITIS (ICD-530.81) ANEMIA, OTHER, UNSPECIFIED (ICD-285.9) New Problems: SCIATICA, 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  Plan New Medications/Changes: PERCOCET 5-325 MG TABS (OXYCODONE-ACETAMINOPHEN) 1 by mouth q  8hrs prn  #20 x 0, 12/25/2010, Hassan Rowan MD NEURONTIN 300 MG CAPS (GABAPENTIN) 1 by mouth q day at night may increase to twice a day if needed  #30 x 0, 12/25/2010, Hassan Rowan MD SKELAXIN 800 MG TABS (METAXALONE) 1 by mouth q 8hrs to q 12 as needed muscle relaxant  #30 x 1, 12/25/2010, Hassan Rowan MD MOBIC 7.5 MG TABS (MELOXICAM) 1 by mouth q day  #30 x 0, 12/25/2010, Hassan Rowan MD  New Orders: New Patient Level III [99203] Ketorolac-Toradol 15mg  [E4540] Admin of Therapeutic Inj  intramuscular or subcutaneous [96372] Follow Up: Follow up in 2-3 days if no improvement, Follow up with Primary Physician  The patient and/or caregiver has been counseled thoroughly with regard to medications prescribed including dosage, schedule, interactions, rationale for use, and possible side effects and they verbalize understanding.  Diagnoses and expected course of recovery discussed and will return if not improved as expected or if the condition worsens. Patient and/or caregiver verbalized understanding.  Prescriptions: PERCOCET 5-325  MG TABS (OXYCODONE-ACETAMINOPHEN) 1 by mouth q 8hrs prn  #20 x 0   Entered and Authorized by:   Hassan Rowan MD   Signed by:   Hassan Rowan MD on 12/25/2010   Method used:   Printed then faxed to ...       [facility]* (retail)       [address] [place], [place]  [address]       Ph: [phone]       Fax: [phone]   RxID:   [id number] NEURONTIN 300 MG CAPS (GABAPENTIN) 1 by mouth q day at night may increase to twice a day if needed  #30 x 0   Entered and Authorized by:   Hassan Rowan MD   Signed by:   Hassan Rowan MD on 12/25/2010   Method used:   Printed then faxed to ...       [facility]* (retail)       [address] [place], [place]  [address]       Ph: [phone]       Fax: [phone]   RxID:   [id number] SKELAXIN 800 MG TABS (METAXALONE) 1 by mouth q 8hrs to q 12 as needed muscle relaxant  #30 x 1   Entered and Authorized by:    Hassan Rowan MD   Signed by:   Hassan Rowan MD on 12/25/2010   Method used:   Printed then faxed to ...       [facility]* (retail)       [address] [place], [place]  [address]       Ph: [phone]       Fax: [phone]   RxID:   [id number] MOBIC 7.5 MG TABS (MELOXICAM) 1 by mouth q day  #30 x 0   Entered and Authorized by:   Hassan Rowan MD   Signed by:   Hassan Rowan MD on 12/25/2010   Method used:   Printed then faxed to ...       [facility]* (retail)       [address] [place], [place]  [address]       Ph: [phone]       Fax: [phone]   RxID:   [id number]   Patient Instructions: 1)  Please schedule a follow-up appointment as needed. 2)  Please schedule an appointment with your primary doctor in :3-5 dayus if not better 3)  Most patients (90%) with low back pain will improve with time (2-6 weeks). Keep active but avoid activities that are painful. Apply moist heat and/or ice to lower back several times a day.  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

## 2011-10-16 ENCOUNTER — Ambulatory Visit (INDEPENDENT_AMBULATORY_CARE_PROVIDER_SITE_OTHER): Payer: Medicare Other | Admitting: Family Medicine

## 2011-10-16 VITALS — Ht 65.0 in

## 2011-10-16 DIAGNOSIS — D599 Acquired hemolytic anemia, unspecified: Secondary | ICD-10-CM

## 2011-10-16 MED ORDER — CYANOCOBALAMIN 1000 MCG/ML IJ SOLN
1000.0000 ug | Freq: Once | INTRAMUSCULAR | Status: AC
  Administered 2011-10-16: 1000 ug via INTRAMUSCULAR

## 2011-10-16 NOTE — Patient Instructions (Signed)
none

## 2011-10-16 NOTE — Progress Notes (Signed)
  Subjective:    Patient ID: Kristi Bell, female    DOB: 1950-06-10, 61 y.o.   MRN: [id number] B12 injection. Pt will schedule OV HPI    Review of Systems     Objective:   Physical Exam        Assessment & Plan:  Patient not seen today

## 2011-10-18 ENCOUNTER — Encounter: Payer: Self-pay | Admitting: *Deleted

## 2011-10-22 ENCOUNTER — Encounter: Payer: Self-pay | Admitting: Physician Assistant

## 2011-10-22 ENCOUNTER — Ambulatory Visit (INDEPENDENT_AMBULATORY_CARE_PROVIDER_SITE_OTHER): Payer: Medicare Other | Admitting: Physician Assistant

## 2011-10-22 VITALS — BP 117/49 | HR 73 | Ht 64.0 in | Wt 205.0 lb

## 2011-10-22 DIAGNOSIS — Z23 Encounter for immunization: Secondary | ICD-10-CM

## 2011-10-22 DIAGNOSIS — E538 Deficiency of other specified B group vitamins: Secondary | ICD-10-CM

## 2011-10-22 DIAGNOSIS — Z79899 Other long term (current) drug therapy: Secondary | ICD-10-CM

## 2011-10-22 MED ORDER — AMBULATORY NON FORMULARY MEDICATION: Status: DC

## 2011-10-22 NOTE — Patient Instructions (Addendum)
Will get b12 labs today and call with results. Continue monthly B12 shots until we determine status of B12. Gave rx for Zostavax.

## 2011-10-22 NOTE — Progress Notes (Signed)
  Subjective:    Patient ID: Fran Lowes, female    DOB: 09/22/1950, 61 y.o.   MRN: [id number]  HPI Patient presents to the clinic to followup on thiamine B12 deficiency. She came in last week and got her B12 shot. She has not have labs drawn here in almost a year. patient lives in [place] for 8 months out of the year and only lived here for the summers. She has a PCP in the [place] that manages her ongoing health care needs and regular blood work. Her last blood work was done on May 23 by Dr. Chestine Spore. She uses this just to maintain her B12 shots and to be here if she needs [place] for acute illness. To note she did have a new diagnosis of hemochromatosis which she has started Exjade for management. She is having a upcoming procedure to reevaluate her ongoing problem with biliary stones.   She reports to be doing and feeling pretty well today with no immediate concerns. She wants to continue with monthly B12 shots.  We did discuss her need for shingles vaccine and patient wants prescription to get a pharmacy.   Review of Systems     Objective:   Physical Exam  Constitutional: She is oriented to person, place, and time. She appears well-developed and well-nourished.  HENT:  Head: Normocephalic and atraumatic.  Cardiovascular: Normal rate, regular rhythm, normal heart sounds and intact distal pulses.   Pulmonary/Chest: Effort normal and breath sounds normal. She has no wheezes.  Neurological: She is alert and oriented to person, place, and time.  Skin: Skin is warm and dry.  Psychiatric: She has a normal mood and affect. Her behavior is normal.          Assessment &plan:    Vitamin b12 def- Will continue to give monthly b12 shots as long as lab work supports. Will get b12 level today and call with results. As long as not too high will get b12 shot in 3 weeks.   Need for vaccine- Gave rx for Zostavax.

## 2011-11-12 ENCOUNTER — Encounter: Payer: Self-pay | Admitting: Physician Assistant

## 2011-11-12 ENCOUNTER — Ambulatory Visit: Payer: Medicare Other

## 2011-11-12 ENCOUNTER — Ambulatory Visit (INDEPENDENT_AMBULATORY_CARE_PROVIDER_SITE_OTHER): Payer: Medicare Other | Admitting: Physician Assistant

## 2011-11-12 VITALS — BP 106/46 | HR 70 | Temp 98.7°F | Ht 64.0 in | Wt 211.0 lb

## 2011-11-12 DIAGNOSIS — R031 Nonspecific low blood-pressure reading: Secondary | ICD-10-CM

## 2011-11-12 DIAGNOSIS — J329 Chronic sinusitis, unspecified: Secondary | ICD-10-CM

## 2011-11-12 MED ORDER — AMOXICILLIN-POT CLAVULANATE 875-125 MG PO TABS: 1.0000 | ORAL_TABLET | Freq: Two times a day (BID) | ORAL | Status: AC

## 2011-11-12 NOTE — Patient Instructions (Addendum)
Atenolol cut in half follow up with nurse blood pressure check in 2 weeks.   Augmentin twice a day for 10 days. Consider one day of decongestant to help relieve pressure and continue with BC powders for headache.

## 2011-11-13 NOTE — Progress Notes (Signed)
  Subjective:    Patient ID: Kristi Bell, female    DOB: 01-09-1951, 61 y.o.   MRN: [id number]  Sinusitis This is a new problem. The current episode started in the past 7 days. The problem has been gradually worsening since onset. There has been no fever. Her pain is at a severity of 6/10. The pain is moderate. Associated symptoms include congestion, coughing, headaches and sinus pressure. Pertinent negatives include no chills, diaphoresis, ear pain, hoarse voice, neck pain, shortness of breath, sneezing, sore throat or swollen glands. Past treatments include acetaminophen (Nyquil, Tylenol cold and sinus). The treatment provided no relief.   Has felt lightheaded lately but contributed it to sinus pressure and discomfort. She has checked her blood pressure a few times and it has been running low espeically on the bottom sometimes in the low 40's.    Review of Systems  Constitutional: Negative for chills and diaphoresis.  HENT: Positive for congestion and sinus pressure. Negative for ear pain, sore throat, hoarse voice, sneezing and neck pain.   Respiratory: Positive for cough. Negative for shortness of breath.   Neurological: Positive for headaches.       Objective:   Physical Exam  Constitutional: She is oriented to person, place, and time. She appears well-developed and well-nourished.  HENT:  Head: Normocephalic and atraumatic.  Right Ear: External ear normal.  Left Ear: External ear normal.  Nose: Nose normal.  Mouth/Throat: No oropharyngeal exudate.       TM's normal bilaterally. Left sided maxillary tenderness to palpation. Oropharynx is erythematous.  Eyes: Conjunctivae are normal.  Neck: Normal range of motion. Neck supple.       Anterior superficial enlarged lymph nodes bilaterally.  Cardiovascular: Normal rate, regular rhythm, normal heart sounds and intact distal pulses.   Pulmonary/Chest: Effort normal and breath sounds normal. She has no wheezes.  Lymphadenopathy:    She  has cervical adenopathy.  Neurological: She is alert and oriented to person, place, and time.  Skin: Skin is warm and dry.  Psychiatric: She has a normal mood and affect. Her behavior is normal.          Assessment & Plan:  Low blood pressure reading- will cut atenolol in half and take that daily. She is on beta blocker for migraine prevention. Hopefully this will increase bp a little without affecting migraine prevention. Will recheck with nurse visit in 2 weeks.  Sinusitis-Treated with amoxicillin for 10 days. Consider decongestant for one day to help relieve pressure. Stay hydrated. If not improving call office.

## 2011-11-16 ENCOUNTER — Ambulatory Visit (INDEPENDENT_AMBULATORY_CARE_PROVIDER_SITE_OTHER): Payer: Medicare Other | Admitting: Physician Assistant

## 2011-11-16 VITALS — BP 117/63 | HR 71

## 2011-11-16 DIAGNOSIS — E538 Deficiency of other specified B group vitamins: Secondary | ICD-10-CM

## 2011-11-16 MED ORDER — CYANOCOBALAMIN 1000 MCG/ML IJ SOLN
1000.0000 ug | Freq: Once | INTRAMUSCULAR | Status: AC
  Administered 2011-11-16: 1000 ug via INTRAMUSCULAR

## 2011-11-16 NOTE — Progress Notes (Signed)
  Subjective:    Patient ID: Kristi Bell, female    DOB: 1951/03/01, 61 y.o.   MRN: [id number] B12 injection. HPI    Review of Systems     Objective:   Physical Exam        Assessment & Plan:

## 2011-11-26 ENCOUNTER — Ambulatory Visit (INDEPENDENT_AMBULATORY_CARE_PROVIDER_SITE_OTHER): Payer: Medicare Other | Admitting: Physician Assistant

## 2011-11-26 VITALS — BP 122/58 | HR 75

## 2011-11-26 DIAGNOSIS — R031 Nonspecific low blood-pressure reading: Secondary | ICD-10-CM

## 2011-11-26 NOTE — Progress Notes (Signed)
  Subjective:    Patient ID: Kristi Bell, female    DOB: 12-08-1950, 61 y.o.   MRN: [id number]  HPI    Review of Systems     Objective:   Physical Exam        Assessment & Plan:  Pt notified of instructions. KG LPN

## 2011-11-26 NOTE — Patient Instructions (Signed)
BP looks great. Continue with 1/2 atenolol. Follow up as needed.

## 2011-11-26 NOTE — Progress Notes (Signed)
  Subjective:    Patient ID: Kristi Bell, female    DOB: 1951-02-07, 61 y.o.   MRN: [id number] Recheck BP- low at last visit. Pt is taking 1/2 tab of the Atenolol as directed. HPI    Review of Systems     Objective:   Physical Exam        Assessment & Plan:  Blood pressure looks great. Stay with 1/2 atenolol daily. Follow up as needed. Tandy Gaw PA-C

## 2011-12-13 ENCOUNTER — Ambulatory Visit (INDEPENDENT_AMBULATORY_CARE_PROVIDER_SITE_OTHER): Payer: Medicare Other | Admitting: Family Medicine

## 2011-12-13 ENCOUNTER — Encounter: Payer: Self-pay | Admitting: Family Medicine

## 2011-12-13 VITALS — BP 117/53 | HR 73 | Wt 213.0 lb

## 2011-12-13 DIAGNOSIS — M25569 Pain in unspecified knee: Secondary | ICD-10-CM

## 2011-12-13 DIAGNOSIS — M25561 Pain in right knee: Secondary | ICD-10-CM

## 2011-12-13 DIAGNOSIS — D580 Hereditary spherocytosis: Secondary | ICD-10-CM

## 2011-12-13 MED ORDER — HYDROCODONE-ACETAMINOPHEN 5-325 MG PO TABS: 2.0000 | ORAL_TABLET | Freq: Three times a day (TID) | ORAL | Status: DC | PRN

## 2011-12-13 MED ORDER — MELOXICAM 7.5 MG PO TABS: 7.5000 mg | ORAL_TABLET | Freq: Two times a day (BID) | ORAL | Status: DC | PRN

## 2011-12-13 NOTE — Progress Notes (Signed)
  Subjective:    Patient ID: Kristi Bell, female    DOB: [birth year], 61 y.o.   MRN: [id number]  HPI Tuesday ( 2 days ago) in the grocery store shopping and felt some increased pain in her knee.  Took some tramadol and only helped minimally. Hurts all over but worse laterally. Worse with walking and standing. Sitting and resting is better.  Icing it too.  Took a muscle relaxer from sciatica.  Does feel a little better today. Last xray back in the spring and had a prednisone shot in April. Not giving out or popping.  Had a strained feeling.    Review of Systems     Objective:   Physical Exam  Constitutional: She is oriented to person, place, and time. She appears well-developed and well-nourished.  HENT:  Head: Normocephalic and atraumatic.  Cardiovascular: Regular rhythm.   Pulmonary/Chest: Effort normal and breath sounds normal.  Musculoskeletal:       Right knee with normal range of motion. She has trace swelling. She's tender along the lateral joint line. Some crepitus. Strength is 5 out of 5 in hip, knee, ankle bilaterally.  Slight inc laxity on the right compared to the left with anterior drawer. No sig pain with valgus or varrus stress. Neg Mc-Murrays.    Neurological: She is alert and oriented to person, place, and time.  Skin: Skin is warm and dry.  Psychiatric: She has a normal mood and affect. Her behavior is normal.          Assessment & Plan:  Right knee pain - she does have a history of osteoarthritis in that knee. He just had x-rays in April so I do not recommend repeating at this time since there was no actual trauma, popping or giving out. Trial of meloxicam, continue icing, rest and elevation. If she's not improving over the next 2-3 weeks and consider a steroid injection since she does respond well to these in the past. Her last her injection was in April by her rheumatologist. Also gave her very small point hydrocodone to use in the evenings for pain since the tramadol  is not really helping her. I recommend stopping the muscle relaxer. This most likely would not help her pain for her knee.Marland Kitchen

## 2011-12-13 NOTE — Patient Instructions (Signed)
Knee Pain The knee is the complex joint between your thigh and your lower leg. It is made up of bones, tendons, ligaments, and cartilage. The bones that make up the knee are:  The femur in the thigh.   The tibia and fibula in the lower leg.   The patella or kneecap riding in the groove on the lower femur.  CAUSES  Knee pain is a common complaint with many causes. A few of these causes are:  Injury, such as:   A ruptured ligament or tendon injury.   Torn cartilage.   Medical conditions, such as:   Gout   Arthritis   Infections   Overuse, over training or overdoing a physical activity.  Knee pain can be minor or severe. Knee pain can accompany debilitating injury. Minor knee problems often respond well to self-care measures or get well on their own. More serious injuries may need medical intervention or even surgery. SYMPTOMS The knee is complex. Symptoms of knee problems can vary widely. Some of the problems are:  Pain with movement and weight bearing.   Swelling and tenderness.   Buckling of the knee.   Inability to straighten or extend your knee.   Your knee locks and you cannot straighten it.   Warmth and redness with pain and fever.   Deformity or dislocation of the kneecap.  DIAGNOSIS  Determining what is wrong may be very straight forward such as when there is an injury. It can also be challenging because of the complexity of the knee. Tests to make a diagnosis may include:  Your caregiver taking a history and doing a physical exam.   Routine X-rays can be used to rule out other problems. X-rays will not reveal a cartilage tear. Some injuries of the knee can be diagnosed by:   Arthroscopy a surgical technique by which a small video camera is inserted through tiny incisions on the sides of the knee. This procedure is used to examine and repair internal knee joint problems. Tiny instruments can be used during arthroscopy to repair the torn knee cartilage  (meniscus).   Arthrography is a radiology technique. A contrast liquid is directly injected into the knee joint. Internal structures of the knee joint then become visible on X-ray film.   An MRI scan is a non x-ray radiology procedure in which magnetic fields and a computer produce two- or three-dimensional images of the inside of the knee. Cartilage tears are often visible using an MRI scanner. MRI scans have largely replaced arthrography in diagnosing cartilage tears of the knee.   Blood work.   Examination of the fluid that helps to lubricate the knee joint (synovial fluid). This is done by taking a sample out using a needle and a syringe.  TREATMENT The treatment of knee problems depends on the cause. Some of these treatments are:  Depending on the injury, proper casting, splinting, surgery or physical therapy care will be needed.   Give yourself adequate recovery time. Do not overuse your joints. If you begin to get sore during workout routines, back off. Slow down or do fewer repetitions.   For repetitive activities such as cycling or running, maintain your strength and nutrition.   Alternate muscle groups. For example if you are a weight lifter, work the upper body on one day and the lower body the next.   Either tight or weak muscles do not give the proper support for your knee. Tight or weak muscles do not absorb the stress placed   on the knee joint. Keep the muscles surrounding the knee strong.   Take care of mechanical problems.   If you have flat feet, orthotics or special shoes may help. See your caregiver if you need help.   Arch supports, sometimes with wedges on the inner or outer aspect of the heel, can help. These can shift pressure away from the side of the knee most bothered by osteoarthritis.   A brace called an "unloader" brace also may be used to help ease the pressure on the most arthritic side of the knee.   If your caregiver has prescribed crutches, braces,  wraps or ice, use as directed. The acronym for this is PRICE. This means protection, rest, ice, compression and elevation.   Nonsteroidal anti-inflammatory drugs (NSAID's), can help relieve pain. But if taken immediately after an injury, they may actually increase swelling. Take NSAID's with food in your stomach. Stop them if you develop stomach problems. Do not take these if you have a history of ulcers, stomach pain or bleeding from the bowel. Do not take without your caregiver's approval if you have problems with fluid retention, heart failure, or kidney problems.   For ongoing knee problems, physical therapy may be helpful.   Glucosamine and chondroitin are over-the-counter dietary supplements. Both may help relieve the pain of osteoarthritis in the knee. These medicines are different from the usual anti-inflammatory drugs. Glucosamine may decrease the rate of cartilage destruction.   Injections of a corticosteroid drug into your knee joint may help reduce the symptoms of an arthritis flare-up. They may provide pain relief that lasts a few months. You may have to wait a few months between injections. The injections do have a small increased risk of infection, water retention and elevated blood sugar levels.   Hyaluronic acid injected into damaged joints may ease pain and provide lubrication. These injections may work by reducing inflammation. A series of shots may give relief for as long as 6 months.   Topical painkillers. Applying certain ointments to your skin may help relieve the pain and stiffness of osteoarthritis. Ask your pharmacist for suggestions. Many over the-counter products are approved for temporary relief of arthritis pain.   In some countries, doctors often prescribe topical NSAID's for relief of chronic conditions such as arthritis and tendinitis. A review of treatment with NSAID creams found that they worked as well as oral medications but without the serious side effects.    PREVENTION  Maintain a healthy weight. Extra pounds put more strain on your joints.   Get strong, stay limber. Weak muscles are a common cause of knee injuries. Stretching is important. Include flexibility exercises in your workouts.   Be smart about exercise. If you have osteoarthritis, chronic knee pain or recurring injuries, you may need to change the way you exercise. This does not mean you have to stop being active. If your knees ache after jogging or playing basketball, consider switching to swimming, water aerobics or other low-impact activities, at least for a few days a week. Sometimes limiting high-impact activities will provide relief.   Make sure your shoes fit well. Choose footwear that is right for your sport.   Protect your knees. Use the proper gear for knee-sensitive activities. Use kneepads when playing volleyball or laying carpet. Buckle your seat belt every time you drive. Most shattered kneecaps occur in car accidents.   Rest when you are tired.  SEEK MEDICAL CARE IF:  You have knee pain that is continual and does not   seem to be getting better.  SEEK IMMEDIATE MEDICAL CARE IF:  Your knee joint feels hot to the touch and you have a high fever. MAKE SURE YOU:   Understand these instructions.   Will watch your condition.   Will get help right away if you are not doing well or get worse.  Document Released: 02/25/2007 Document Revised: 04/19/2011 Document Reviewed: 02/25/2007 [employer] Patient Information 2012 [employer]. 

## 2011-12-17 ENCOUNTER — Ambulatory Visit (INDEPENDENT_AMBULATORY_CARE_PROVIDER_SITE_OTHER): Payer: Medicare Other | Admitting: Family Medicine

## 2011-12-17 DIAGNOSIS — D599 Acquired hemolytic anemia, unspecified: Secondary | ICD-10-CM

## 2011-12-17 MED ORDER — CYANOCOBALAMIN 1000 MCG/ML IJ SOLN
1000.0000 ug | Freq: Once | INTRAMUSCULAR | Status: AC
  Administered 2011-12-17: 1000 ug via INTRAMUSCULAR

## 2011-12-17 NOTE — Progress Notes (Signed)
  Subjective:    Patient ID: Kristi Bell, female    DOB: 07/10/1950, 61 y.o.   MRN: [id number]  HPI B12 injection   Review of Systems     Objective:   Physical Exam        Assessment & Plan:

## 2011-12-24 ENCOUNTER — Ambulatory Visit (INDEPENDENT_AMBULATORY_CARE_PROVIDER_SITE_OTHER): Payer: Medicare Other | Admitting: Family Medicine

## 2011-12-24 ENCOUNTER — Encounter: Payer: Self-pay | Admitting: Family Medicine

## 2011-12-24 VITALS — BP 124/53 | HR 66 | Temp 98.4°F | Resp 20 | Wt 219.0 lb

## 2011-12-24 DIAGNOSIS — M25569 Pain in unspecified knee: Secondary | ICD-10-CM

## 2011-12-24 MED ORDER — HYDROCODONE-ACETAMINOPHEN 5-325 MG PO TABS: 2.0000 | ORAL_TABLET | Freq: Three times a day (TID) | ORAL | Status: AC | PRN

## 2011-12-24 NOTE — Progress Notes (Signed)
  Subjective:    Patient ID: Kristi Bell, female    DOB: Sep 17, 1950, 61 y.o.   MRN: [id number]  HPI She is here for followup knee pain. She says she actually feels it has gotten worse since she was last year. She says she was trying to use her walker just for support and actually on Friday and that even a little better but then on Saturday was worsened she's not using her walker. She tried to call her orthopedist and he could not see her until the middle of September. Her last her an injection into the right knee was April 17 of this year. She is not a candidate for knee replacement because of your osteoporosis. They also felt she was not a candidate for arthroscopy. She has had some Synvisc injections in the past. She does note that the knee was a little bit more swollen today than usual. She has continued to take the meds but without significant relief. The hydrocodone does help some but she is mostly been reserving it for bedtime and says she only has a couple of tablets left.   Review of Systems     Objective:   Physical Exam        Assessment & Plan:  Knee pain-discussed different options. She preferred injection today for acute pain relief. Patient tolerated the procedure well. She has a followup appointment with Dr.Olin in about 4 weeks. At that time if she's not improved she can discuss further treatment with him. She might benefit from repeat Synvisc injections. I did refill her hydrocodone for now encouraged her to use it primarily at bedtime. She can stop her anti-inflammatory since we have injected the knee. Call if any concerns or problems. Did encourage her to ice it a couple times today and keep it elevated and rested for the next 2-3 days.  Knee  with Injection Procedure Note  Pre-operative Diagnosis: right acute on chornic knee pain  Post-operative Diagnosis: same  Indications: Symptom relief from osteoarthritis  Anesthesia: Lidocaine 1% without epinephrine without added  sodium bicarbonate  Procedure Details   Verbal consent was obtained for the procedure. The joint was prepped with Betadine.  A 22 gauge needle was inserted into the superior aspect of the joint from a lateral approach.  9 ml 1% lidocaine and 1 ml of triamcinolone (KENALOG) 40mg /ml was then injected into the joint through the same needle. The needle was removed and the area cleansed and dressed.  Complications:  None; patient tolerated the procedure well.

## 2011-12-27 ENCOUNTER — Telehealth: Payer: Self-pay | Admitting: Family Medicine

## 2011-12-27 NOTE — Telephone Encounter (Signed)
Patient called advised that she had a knee injection Monday and it is not getting any better and the shot did not help. Request to have a call back to know what she needs to do. Advise Dr. Judie Petit said if not better to call back. Thanks

## 2011-12-27 NOTE — Telephone Encounter (Signed)
Option are to keep f/u with Dr. Charlann Boxer,  Or consider repeat simvisc injection. When was  Her last MRI of the knee?

## 2011-12-27 NOTE — Telephone Encounter (Signed)
Pt is coming 12/28/11 to see Dr. Benjamin Stain for consult of the rt knee

## 2011-12-27 NOTE — Telephone Encounter (Signed)
Please advise 

## 2011-12-27 NOTE — Telephone Encounter (Signed)
Pt states that she would like to think about the 2nd injection b/c she didn't get much relief from it. She would like to know if you called Dr. Nilsa Nutting office if you can get her in sooner than 01/23/12. Pt states that it has been at least a couple of years since last MRI of knee. She is unsure of the date.

## 2011-12-28 ENCOUNTER — Ambulatory Visit (INDEPENDENT_AMBULATORY_CARE_PROVIDER_SITE_OTHER): Payer: Medicare Other | Admitting: Sports Medicine

## 2011-12-28 ENCOUNTER — Ambulatory Visit (INDEPENDENT_AMBULATORY_CARE_PROVIDER_SITE_OTHER): Payer: Medicare Other

## 2011-12-28 ENCOUNTER — Encounter: Payer: Self-pay | Admitting: Sports Medicine

## 2011-12-28 VITALS — BP 146/57 | HR 66 | Temp 99.3°F | Resp 18 | Wt 215.0 lb

## 2011-12-28 DIAGNOSIS — M1711 Unilateral primary osteoarthritis, right knee: Secondary | ICD-10-CM

## 2011-12-28 DIAGNOSIS — M170 Bilateral primary osteoarthritis of knee: Secondary | ICD-10-CM | POA: Insufficient documentation

## 2011-12-28 DIAGNOSIS — IMO0002 Reserved for concepts with insufficient information to code with codable children: Secondary | ICD-10-CM

## 2011-12-28 DIAGNOSIS — M25569 Pain in unspecified knee: Secondary | ICD-10-CM

## 2011-12-28 DIAGNOSIS — M1710 Unilateral primary osteoarthritis, unspecified knee: Secondary | ICD-10-CM

## 2011-12-28 MED ORDER — MELOXICAM 15 MG PO TABS: ORAL_TABLET | ORAL | Status: AC

## 2011-12-28 NOTE — Progress Notes (Signed)
Patient ID: Kristi Bell, female   DOB: [birth year], 61 y.o.   MRN: [id number] Subjective:    CC: right knee pain  HPI:and is a very pleasant 61 year old female is coming to see me for her right knee pain. She has known DJD,and has had several steroid injections in the past. These each last for several months. She has also undergone 2 courses of Synvisc injections. These last approximately 6-8 months. She recently had a steroid injection, which was moderately effective. She desires to restart viscous supplementation.  Past medical history, Surgical history, Family history, Social history, Allergies, and medications have been entered into the medical record, reviewed, and no changes needed.   Review of Systems: No fevers, chills, night sweats, weight loss, chest pain, or shortness of breath.   Objective:    General: Well Developed, well nourished, and in no acute distress.  Neuro: Alert and oriented x3, extra-ocular muscles intact.  HEENT: Normocephalic, atraumatic, pupils equal round reactive to light, neck supple, no masses, no lymphadenopathy, thyroid nonpalpable.  Skin: Warm and dry, no rashes. Cardiac: Regular rate and rhythm, no murmurs rubs or gallops.  Respiratory: Clear to auscultation bilaterally. Not using accessory muscles, speaking in full sentences. Right Knee: Knee is obviously swollen. There is a moderate effusion, with a fluid wave. She is tender to palpation along the medial joint line. ROM full in flexion and extension and lower leg rotation. Ligaments with solid consistent endpoints including ACL, PCL, LCL, MCL. Negative Mcmurray's, Apley's, and Thessalonian tests. Non painful patellar compression. Patellar glide without crepitus. Patellar and quadriceps tendons unremarkable. Hamstring and quadriceps strength is normal.   Procedure: Real-time Ultrasound Guided Injection of right knee Device: GE Logiq E  Ultrasound guided injection is preferred based studies that show  increased duration, increased effect, greater accuracy, decreased procedural pain, increased response rate, and decreased cost with ultrasound guided versus blind injection.  Verbal informed consent obtained.  Time-out conducted.  Noted no overlying erythema, induration, or other signs of local infection.  Skin prepped in a sterile fashion.  Local anesthesia: Topical Ethyl chloride.  With sterile technique and under real time ultrasound guidance:  Suprapatellar recess anesthetized with 5 cc of lidocaine injected in a fanlike pattern. Afterwards 60 cc syringe with an 18-gauge needle was inserted under real-time ultrasound guidance into the suprapatellar recess. Approximately 18 cc of straw-colored fluid was aspirated. The syringe was switched, and 25 mg/2.5 mL of Supartz (sodium hyaluronate) in a prefilled syringe was injected easily into the knee through the 18-gauge needle. Completed without difficulty  Pain immediately resolved suggesting accurate placement of the medication.  Advised to call if fevers/chills, erythema, induration, drainage, or persistent bleeding.  Images permanently stored and available for review in the ultrasound unit.  Impression: Technically successful ultrasound guided injection.  Impression and Recommendations:

## 2011-12-28 NOTE — Assessment & Plan Note (Addendum)
I think that proceeding with another round of Visco supplementation is a good idea. As she is Medicare, we do not need prior approval for Supartz. I aspirated her knee, and then injected the Supartz. She will also wear a knee sleeve, and do daily rehabilitation exercises. I am changing her meloxicam from 7.5-15 mg daily. I will see her next week for Supartz injection #2 of 5.

## 2012-01-03 ENCOUNTER — Ambulatory Visit (INDEPENDENT_AMBULATORY_CARE_PROVIDER_SITE_OTHER): Payer: Medicare Other | Admitting: Sports Medicine

## 2012-01-03 ENCOUNTER — Encounter: Payer: Self-pay | Admitting: Sports Medicine

## 2012-01-03 VITALS — BP 119/58 | HR 71 | Temp 98.4°F | Resp 16 | Wt 213.0 lb

## 2012-01-03 DIAGNOSIS — M1711 Unilateral primary osteoarthritis, right knee: Secondary | ICD-10-CM

## 2012-01-03 DIAGNOSIS — IMO0002 Reserved for concepts with insufficient information to code with codable children: Secondary | ICD-10-CM

## 2012-01-03 DIAGNOSIS — M1710 Unilateral primary osteoarthritis, unspecified knee: Secondary | ICD-10-CM

## 2012-01-03 NOTE — Assessment & Plan Note (Signed)
Supartz injection #2 of 5 given today. She will come back in one week for injection #3. I would like her to continue with Mobic, as well as the knee sleeve, as well as icing.

## 2012-01-03 NOTE — Progress Notes (Signed)
Patient ID: Kristi Bell, female   DOB: [birth year], 61 y.o.   MRN: [id number]  Subjective:    CC: right knee pain  HPI: Aariona is here for Supartz injection #2 of 5.  Past medical history, Surgical history, Family history, Social history, Allergies, and medications have been entered into the medical record, reviewed, and no changes needed.   Review of Systems: No fevers, chills, night sweats, weight loss, chest pain, or shortness of breath.   Objective:    General: Well Developed, well nourished, and in no acute distress.   Procedure: Real-time Ultrasound Guided Injection of right knee Device: GE Logiq E  Ultrasound guided injection is preferred based studies that show increased duration, increased effect, greater accuracy, decreased procedural pain, increased response rate, and decreased cost with ultrasound guided versus blind injection.  Verbal informed consent obtained.  Time-out conducted.  Noted no overlying erythema, induration, or other signs of local infection.  Skin prepped in a sterile fashion.  Local anesthesia: Topical Ethyl chloride.  With sterile technique and under real time ultrasound guidance:   22-gauge needle was inserted under real-time ultrasound guidance into the suprapatellar recess and 25 mg/2.5 mL of Supartz (sodium hyaluronate) in a prefilled syringe was injected easily into the knee. Completed without difficulty  Pain immediately resolved suggesting accurate placement of the medication.  Advised to call if fevers/chills, erythema, induration, drainage, or persistent bleeding.  Images permanently stored and available for review in the ultrasound unit.  Impression: Technically successful ultrasound guided injection.  Impression and Recommendations:

## 2012-01-08 ENCOUNTER — Encounter: Payer: Self-pay | Admitting: Family Medicine

## 2012-01-11 ENCOUNTER — Ambulatory Visit (INDEPENDENT_AMBULATORY_CARE_PROVIDER_SITE_OTHER): Payer: Medicare Other | Admitting: Sports Medicine

## 2012-01-11 ENCOUNTER — Encounter: Payer: Self-pay | Admitting: Sports Medicine

## 2012-01-11 VITALS — BP 118/51 | HR 79 | Temp 98.2°F | Wt 214.0 lb

## 2012-01-11 DIAGNOSIS — M1711 Unilateral primary osteoarthritis, right knee: Secondary | ICD-10-CM

## 2012-01-11 DIAGNOSIS — M1710 Unilateral primary osteoarthritis, unspecified knee: Secondary | ICD-10-CM

## 2012-01-11 DIAGNOSIS — IMO0002 Reserved for concepts with insufficient information to code with codable children: Secondary | ICD-10-CM

## 2012-01-11 NOTE — Progress Notes (Signed)
Patient ID: Kristi Bell, female   DOB: January 08, 1951, 61 y.o.   MRN: [id number]  Subjective:   CC: right knee pain   HPI: Lynne is here for Supartz injection #3 of 5.   Past medical history, Surgical history, Family history, Social history, Allergies, and medications have been entered into the medical record, reviewed, and no changes needed.   Review of Systems: No fevers, chills, night sweats, weight loss, chest pain, or shortness of breath.   Objective:    General: Well Developed, well nourished, and in no acute distress.  Procedure: Real-time Ultrasound Guided Injection of right knee  Device: GE Logiq E  Ultrasound guided injection is preferred based studies that show increased duration, increased effect, greater accuracy, decreased procedural pain, increased response rate, and decreased cost with ultrasound guided versus blind injection.  Verbal informed consent obtained.  Time-out conducted.  Noted no overlying erythema, induration, or other signs of local infection.  Skin prepped in a sterile fashion.  Local anesthesia: Topical Ethyl chloride.  With sterile technique and under real time ultrasound guidance: 22-gauge needle was inserted under real-time ultrasound guidance into the suprapatellar recess and 25 mg/2.5 mL of Supartz (sodium hyaluronate) in a prefilled syringe was injected easily into the knee.  Completed without difficulty  Pain immediately resolved suggesting accurate placement of the medication.  Advised to call if fevers/chills, erythema, induration, drainage, or persistent bleeding.  Images permanently stored and available for review in the ultrasound unit.  Impression: Technically successful ultrasound guided injection.   Impression and Recommendations:

## 2012-01-11 NOTE — Assessment & Plan Note (Signed)
Supartz injection #3 of 5 given today. She will come back in one week for injection #4. I would like her to continue with Mobic, as well as the knee sleeve, as well as icing.

## 2012-01-17 ENCOUNTER — Encounter: Payer: Self-pay | Admitting: *Deleted

## 2012-01-17 ENCOUNTER — Ambulatory Visit (INDEPENDENT_AMBULATORY_CARE_PROVIDER_SITE_OTHER): Payer: Medicare Other | Admitting: Sports Medicine

## 2012-01-17 ENCOUNTER — Encounter: Payer: Self-pay | Admitting: Sports Medicine

## 2012-01-17 ENCOUNTER — Ambulatory Visit: Payer: Medicare Other

## 2012-01-17 VITALS — BP 142/58 | HR 70 | Wt 218.0 lb

## 2012-01-17 DIAGNOSIS — IMO0002 Reserved for concepts with insufficient information to code with codable children: Secondary | ICD-10-CM

## 2012-01-17 DIAGNOSIS — E538 Deficiency of other specified B group vitamins: Secondary | ICD-10-CM

## 2012-01-17 DIAGNOSIS — M1710 Unilateral primary osteoarthritis, unspecified knee: Secondary | ICD-10-CM

## 2012-01-17 DIAGNOSIS — M1711 Unilateral primary osteoarthritis, right knee: Secondary | ICD-10-CM

## 2012-01-17 MED ORDER — CYANOCOBALAMIN 1000 MCG/ML IJ SOLN
1000.0000 ug | Freq: Once | INTRAMUSCULAR | Status: AC
  Administered 2012-01-17: 1000 ug via INTRAMUSCULAR

## 2012-01-17 NOTE — Assessment & Plan Note (Signed)
Supartz injection #4 of 5 given today. She will come back in one week for injection #5. I would like her to continue with Mobic, as well as the knee sleeve, as well as icing.

## 2012-01-17 NOTE — Progress Notes (Signed)
Patient ID: Kristi Bell, female   DOB: [birth year], 61 y.o.   MRN: [id number]   Subjective:   CC: right knee pain   HPI: Kristi Bell is here for Supartz injection #4 of 5.   Past medical history, Surgical history, Family history, Social history, Allergies, and medications have been entered into the medical record, reviewed, and no changes needed.   Review of Systems: No fevers, chills, night sweats, weight loss, chest pain, or shortness of breath.   Objective:    General: Well Developed, well nourished, and in no acute distress.  Procedure: Real-time Ultrasound Guided Injection of right knee  Device: GE Logiq E  Ultrasound guided injection is preferred based studies that show increased duration, increased effect, greater accuracy, decreased procedural pain, increased response rate, and decreased cost with ultrasound guided versus blind injection.  Verbal informed consent obtained.  Time-out conducted.  Noted no overlying erythema, induration, or other signs of local infection.  Skin prepped in a sterile fashion.  Local anesthesia: Topical Ethyl chloride.  With sterile technique and under real time ultrasound guidance: 22-gauge needle was inserted under real-time ultrasound guidance into the suprapatellar recess and 25 mg/2.5 mL of Supartz (sodium hyaluronate) in a prefilled syringe was injected easily into the knee.  Completed without difficulty  Pain immediately resolved suggesting accurate placement of the medication.  Advised to call if fevers/chills, erythema, induration, drainage, or persistent bleeding.  Images permanently stored and available for review in the ultrasound unit.  Impression: Technically successful ultrasound guided injection.   Impression and Recommendations:

## 2012-01-24 ENCOUNTER — Ambulatory Visit (INDEPENDENT_AMBULATORY_CARE_PROVIDER_SITE_OTHER): Payer: Medicare Other | Admitting: Sports Medicine

## 2012-01-24 ENCOUNTER — Encounter: Payer: Self-pay | Admitting: Sports Medicine

## 2012-01-24 VITALS — BP 118/48 | HR 67 | Temp 98.3°F | Wt 218.0 lb

## 2012-01-24 DIAGNOSIS — M1710 Unilateral primary osteoarthritis, unspecified knee: Secondary | ICD-10-CM

## 2012-01-24 DIAGNOSIS — M1711 Unilateral primary osteoarthritis, right knee: Secondary | ICD-10-CM

## 2012-01-24 DIAGNOSIS — IMO0002 Reserved for concepts with insufficient information to code with codable children: Secondary | ICD-10-CM

## 2012-01-24 NOTE — Assessment & Plan Note (Signed)
Supartz injection #5 of 5 given today. Typically have patient come back in one month after the final Supartz injection, she will be in [place], and will call to let us know how she's doing. I would like her to continue with Mobic, as well as the knee sleeve. We can repeat the Supartz injections as early as 6 months if needed.

## 2012-01-24 NOTE — Progress Notes (Signed)
Patient ID: NHYLA NAPPI, female   DOB: 1950/08/08, 61 y.o.   MRN: [id number]  Subjective:   CC: right knee pain   HPI: Kristi Bell is here for Supartz injection #5 of 5.  She continues to note decreased pain, increased stability and increased function.  Past medical history, Surgical history, Family history, Social history, Allergies, and medications have been entered into the medical record, reviewed, and no changes needed.   Review of Systems: No fevers, chills, night sweats, weight loss, chest pain, or shortness of breath.   Objective:    General: Well Developed, well nourished, and in no acute distress.  Procedure: Real-time Ultrasound Guided Injection of right knee  Device: GE Logiq E  Ultrasound guided injection is preferred based studies that show increased duration, increased effect, greater accuracy, decreased procedural pain, increased response rate, and decreased cost with ultrasound guided versus blind injection.  Verbal informed consent obtained.  Time-out conducted.  Noted no overlying erythema, induration, or other signs of local infection.  Skin prepped in a sterile fashion.  Local anesthesia: Topical Ethyl chloride.  With sterile technique and under real time ultrasound guidance: 22-gauge needle was inserted under real-time ultrasound guidance into the suprapatellar recess and 25 mg/2.5 mL of Supartz (sodium hyaluronate) in a prefilled syringe was injected easily into the knee.  Completed without difficulty  Pain immediately resolved suggesting accurate placement of the medication.  Advised to call if fevers/chills, erythema, induration, drainage, or persistent bleeding.  Images permanently stored and available for review in the ultrasound unit.  Impression: Technically successful ultrasound guided injection.   Impression and Recommendations:

## 2012-02-13 ENCOUNTER — Ambulatory Visit (INDEPENDENT_AMBULATORY_CARE_PROVIDER_SITE_OTHER): Payer: Medicare Other | Admitting: Physician Assistant

## 2012-02-13 DIAGNOSIS — Z23 Encounter for immunization: Secondary | ICD-10-CM

## 2012-02-13 DIAGNOSIS — Z298 Encounter for other specified prophylactic measures: Secondary | ICD-10-CM

## 2012-02-13 NOTE — Progress Notes (Signed)
  Subjective:    Patient ID: Kristi Bell, female    DOB: Feb 04, 1951, 61 y.o.   MRN: [id number]  HPI    Review of Systems     Objective:   Physical Exam        Assessment & Plan:  Flu shot given in office. Tandy Gaw PA-C

## 2012-02-18 ENCOUNTER — Ambulatory Visit (INDEPENDENT_AMBULATORY_CARE_PROVIDER_SITE_OTHER): Payer: Medicare Other | Admitting: Family Medicine

## 2012-02-18 DIAGNOSIS — D510 Vitamin B12 deficiency anemia due to intrinsic factor deficiency: Secondary | ICD-10-CM

## 2012-02-18 MED ORDER — CYANOCOBALAMIN 1000 MCG/ML IJ SOLN
1000.0000 ug | Freq: Once | INTRAMUSCULAR | Status: AC
  Administered 2012-02-18: 1000 ug via INTRAMUSCULAR

## 2012-02-18 NOTE — Progress Notes (Signed)
  Subjective:    Patient ID: Kristi Bell, female    DOB: 22-May-1950, 61 y.o.   MRN: [id number]  HPI   Here for a b12 injection Review of Systems     Objective:   Physical Exam        Assessment & Plan:

## 2012-02-25 ENCOUNTER — Telehealth: Payer: Self-pay | Admitting: *Deleted

## 2012-02-25 NOTE — Telephone Encounter (Signed)
Pt called and wanted you to know that the injections you done in her knee have made her knees feel much better and thanks you.

## 2012-02-25 NOTE — Telephone Encounter (Signed)
What what!  Excellent, she can come back to see [place] as often as every 6 months for repeat Visco supplementation considering her excellent response to this.

## 2012-11-10 ENCOUNTER — Ambulatory Visit (INDEPENDENT_AMBULATORY_CARE_PROVIDER_SITE_OTHER): Payer: Medicare Other | Admitting: Family Medicine

## 2012-11-10 ENCOUNTER — Encounter: Payer: Self-pay | Admitting: *Deleted

## 2012-11-10 DIAGNOSIS — D649 Anemia, unspecified: Secondary | ICD-10-CM

## 2012-11-10 MED ORDER — CYANOCOBALAMIN 1000 MCG/ML IJ SOLN
1000.0000 ug | Freq: Once | INTRAMUSCULAR | Status: AC
  Administered 2012-11-10: 1000 ug via INTRAMUSCULAR

## 2012-11-10 NOTE — Progress Notes (Signed)
  Subjective:    Patient ID: Kristi Bell, female    DOB: 02/24/1951, 62 y.o.   MRN: [id number] Pt here for b12 injection. Donne Anon, CMA HPI    Review of Systems     Objective:   Physical Exam        Assessment & Plan:

## 2012-11-10 NOTE — Progress Notes (Signed)
I was present for all necessary aspects of this encounter

## 2012-11-19 ENCOUNTER — Encounter: Payer: Self-pay | Admitting: Sports Medicine

## 2012-11-19 ENCOUNTER — Ambulatory Visit (INDEPENDENT_AMBULATORY_CARE_PROVIDER_SITE_OTHER): Payer: Medicare Other | Admitting: Sports Medicine

## 2012-11-19 VITALS — BP 103/43 | HR 69 | Wt 214.0 lb

## 2012-11-19 DIAGNOSIS — M170 Bilateral primary osteoarthritis of knee: Secondary | ICD-10-CM

## 2012-11-19 DIAGNOSIS — M1710 Unilateral primary osteoarthritis, unspecified knee: Secondary | ICD-10-CM

## 2012-11-19 DIAGNOSIS — IMO0002 Reserved for concepts with insufficient information to code with codable children: Secondary | ICD-10-CM

## 2012-11-19 NOTE — Progress Notes (Signed)
  Subjective:    CC: Recheck knees  HPI: Bilateral knee osteoarthritis:  We finished the Supartz series in her right knee approximately 10 months ago. She has had pain relief until now. Currently she is swelling, pain at the joint line, pain is moderate, persistent. She also has some pain at the joint lines in the left knee.  Past medical history, Surgical history, Family history not pertinant except as noted below, Social history, Allergies, and medications have been entered into the medical record, reviewed, and no changes needed.   Review of Systems: No fevers, chills, night sweats, weight loss, chest pain, or shortness of breath.   Objective:    General: Well Developed, well nourished, and in no acute distress.  Neuro: Alert and oriented x3, extra-ocular muscles intact, sensation grossly intact.  HEENT: Normocephalic, atraumatic, pupils equal round reactive to light, neck supple, no masses, no lymphadenopathy, thyroid nonpalpable.  Skin: Warm and dry, no rashes. Cardiac: Regular rate and rhythm, no murmurs rubs or gallops, no lower extremity edema.  Respiratory: Clear to auscultation bilaterally. Not using accessory muscles, speaking in full sentences. Bilateral Knee: There's tenderness to palpation at the joint lines in both knees, there is a moderate right knee effusion. ROM full in flexion and extension and lower leg rotation. Ligaments with solid consistent endpoints including ACL, PCL, LCL, MCL. Negative Mcmurray's, Apley's, and Thessalonian tests. Non painful patellar compression. Patellar glide without crepitus. Patellar and quadriceps tendons unremarkable. Hamstring and quadriceps strength is normal.   Procedure: Real-time Ultrasound Guided aspiration/injection of right knee Device: GE Logiq E  Verbal informed consent obtained.  Time-out conducted.  Noted no overlying erythema, induration, or other signs of local infection.  Skin prepped in a sterile fashion.  Local  anesthesia: Topical Ethyl chloride.  With sterile technique and under real time ultrasound guidance:  22-gauge needle advanced into the suprapatellar recess, 37 cc of cloudy straw-colored fluid was aspirated, syringe switched and 2 cc Kenalog 40, 4 cc lidocaine injected, syringe switched again and 25 mg/2.5 mL of Supartz (sodium hyaluronate) in a prefilled syringe was injected easily into the knee through a 22-gauge needle.  Completed without difficulty  Pain immediately resolved suggesting accurate placement of the medication.  Advised to call if fevers/chills, erythema, induration, drainage, or persistent bleeding.  Images permanently stored and available for review in the ultrasound unit.  Impression: Technically successful ultrasound guided injection.  Procedure: Real-time Ultrasound Guided Injection of left knee Device: GE Logiq E  Verbal informed consent obtained.  Time-out conducted.  Noted no overlying erythema, induration, or other signs of local infection.  Skin prepped in a sterile fashion.  Local anesthesia: Topical Ethyl chloride.  With sterile technique and under real time ultrasound guidance:  2 cc Kenalog 40, 4 cc lidocaine injected easily into the suprapatellar recess. Completed without difficulty  Pain immediately resolved suggesting accurate placement of the medication.  Advised to call if fevers/chills, erythema, induration, drainage, or persistent bleeding.  Images permanently stored and available for review in the ultrasound unit.  Impression: Technically successful ultrasound guided injection.  Impression and Recommendations:

## 2012-11-19 NOTE — Assessment & Plan Note (Signed)
Aspiration of about 37 cc from the right knee, and steroid and Supartz injections. Steroid injection to the left knee. Fluid was slightly cloudy, I am going to send it off for cell counts and cultures. Return at the end of the week for Supartz injection #2 of 5, we will do this on an accelerated schedule as they are going to [place] in 3 weeks.

## 2012-11-20 LAB — SYNOVIAL CELL COUNT + DIFF, W/ CRYSTALS
Crystals, Fluid: NONE SEEN
Eosinophils-Synovial: 0 % (ref 0–1)
Lymphocytes-Synovial Fld: 0 % (ref 0–20)
Monocyte/Macrophage: 10 % — ABNORMAL LOW (ref 50–90)
Neutrophil, Synovial: 90 % — ABNORMAL HIGH (ref 0–25)
WBC, Synovial: 13270 uL — ABNORMAL HIGH (ref 0–200)

## 2012-11-21 ENCOUNTER — Ambulatory Visit: Payer: Medicare Other | Admitting: Sports Medicine

## 2012-11-23 LAB — BODY FLUID CULTURE
Gram Stain: NONE SEEN
Organism ID, Bacteria: NO GROWTH

## 2012-11-24 ENCOUNTER — Ambulatory Visit (INDEPENDENT_AMBULATORY_CARE_PROVIDER_SITE_OTHER): Payer: Medicare Other | Admitting: Sports Medicine

## 2012-11-24 ENCOUNTER — Encounter: Payer: Self-pay | Admitting: Sports Medicine

## 2012-11-24 VITALS — BP 124/74 | Wt 213.0 lb

## 2012-11-24 DIAGNOSIS — IMO0002 Reserved for concepts with insufficient information to code with codable children: Secondary | ICD-10-CM

## 2012-11-24 DIAGNOSIS — M1710 Unilateral primary osteoarthritis, unspecified knee: Secondary | ICD-10-CM

## 2012-11-24 DIAGNOSIS — M170 Bilateral primary osteoarthritis of knee: Secondary | ICD-10-CM

## 2012-11-24 NOTE — Progress Notes (Signed)
   Procedure: Real-time Ultrasound Guided Injection of right suprapatellar recess Device: GE Logiq E  Verbal informed consent obtained.  Time-out conducted.  Noted no overlying erythema, induration, or other signs of local infection.  Skin prepped in a sterile fashion.  Local anesthesia: Topical Ethyl chloride.  With sterile technique and under real time ultrasound guidance:  25 mg/2.5 mL of Supartz (sodium hyaluronate) in a prefilled syringe was injected easily into the knee through a 22-gauge needle. Completed without difficulty  Pain immediately resolved suggesting accurate placement of the medication.  Advised to call if fevers/chills, erythema, induration, drainage, or persistent bleeding.  Images permanently stored and available for review in the ultrasound unit.  Impression: Technically successful ultrasound guided injection.   

## 2012-11-24 NOTE — Assessment & Plan Note (Signed)
Left knee continues to be pain free after steroid injection last week. Supartz injection #2 of 5 given today. We are going to be doing this on a more accelerated schedule, she'll come back at the end of the week for #3.

## 2012-11-28 ENCOUNTER — Ambulatory Visit (INDEPENDENT_AMBULATORY_CARE_PROVIDER_SITE_OTHER): Payer: Medicare Other | Admitting: Sports Medicine

## 2012-11-28 ENCOUNTER — Encounter: Payer: Self-pay | Admitting: Sports Medicine

## 2012-11-28 VITALS — BP 93/54 | HR 72 | Wt 210.0 lb

## 2012-11-28 DIAGNOSIS — IMO0002 Reserved for concepts with insufficient information to code with codable children: Secondary | ICD-10-CM

## 2012-11-28 DIAGNOSIS — M1710 Unilateral primary osteoarthritis, unspecified knee: Secondary | ICD-10-CM

## 2012-11-28 DIAGNOSIS — M170 Bilateral primary osteoarthritis of knee: Secondary | ICD-10-CM

## 2012-11-28 MED ORDER — MAGNESIUM OXIDE 400 MG PO TABS: 800.0000 mg | ORAL_TABLET | Freq: Every day | ORAL | Status: DC

## 2012-11-28 NOTE — Assessment & Plan Note (Signed)
Left knee continues to be pain free after steroid injection last week. Supartz injection #3 of 5 given today. We are going to be doing this on a more accelerated schedule, she'll come back next week for injection #4 of 5

## 2012-11-28 NOTE — Progress Notes (Signed)
   Procedure: Real-time Ultrasound Guided Injection of right suprapatellar recess Device: GE Logiq E  Verbal informed consent obtained.  Time-out conducted.  Noted no overlying erythema, induration, or other signs of local infection.  Skin prepped in a sterile fashion.  Local anesthesia: Topical Ethyl chloride.  With sterile technique and under real time ultrasound guidance:  25 mg/2.5 mL of Supartz (sodium hyaluronate) in a prefilled syringe was injected easily into the knee through a 22-gauge needle. Completed without difficulty  Pain immediately resolved suggesting accurate placement of the medication.  Advised to call if fevers/chills, erythema, induration, drainage, or persistent bleeding.  Images permanently stored and available for review in the ultrasound unit.  Impression: Technically successful ultrasound guided injection.

## 2012-12-01 ENCOUNTER — Encounter: Payer: Self-pay | Admitting: Family Medicine

## 2012-12-01 ENCOUNTER — Ambulatory Visit (INDEPENDENT_AMBULATORY_CARE_PROVIDER_SITE_OTHER): Payer: Medicare Other | Admitting: Family Medicine

## 2012-12-01 VITALS — BP 112/56 | HR 72 | Temp 98.3°F | Wt 213.0 lb

## 2012-12-01 DIAGNOSIS — A499 Bacterial infection, unspecified: Secondary | ICD-10-CM

## 2012-12-01 DIAGNOSIS — R05 Cough: Secondary | ICD-10-CM

## 2012-12-01 DIAGNOSIS — R059 Cough, unspecified: Secondary | ICD-10-CM

## 2012-12-01 DIAGNOSIS — J329 Chronic sinusitis, unspecified: Secondary | ICD-10-CM

## 2012-12-01 MED ORDER — DOXYCYCLINE HYCLATE 100 MG PO TABS: ORAL_TABLET | ORAL | Status: AC

## 2012-12-01 MED ORDER — HYDROCODONE-HOMATROPINE 5-1.5 MG/5ML PO SYRP: 5.0000 mL | ORAL_SOLUTION | Freq: Three times a day (TID) | ORAL | Status: DC | PRN

## 2012-12-01 NOTE — Progress Notes (Signed)
CC: Kristi Bell is a 62 y.o. female is here for Cough   Subjective: HPI:  Complains of mild to moderate facial pressure that has been present for about a week worsening on a daily basis with a sense of nasal congestion and drainage down the back of her throat. Late last week this became accompanied by a cough that is nonproductive all hours of the day worse in the evenings significantly interfering with sleep trouble getting to sleep but not staying asleep.  She has had fatigue over the weekend with subjective fever on Saturday overall symptoms have been worsening. Interventions have included Zyrtec nothing else. She denies headache, motor or sensory disturbances, confusion, nausea, vomiting, wheezing, shortness of breath nor blood in sputum   Review Of Systems Outlined In HPI  Past Medical History  Diagnosis Date  . Recurrent hepatitis   . Gallstones   . Femur fracture, right   . Sialolithiasis   . Spherocytosis     with hemolytic anemia  . Osteoporosis     severe  . History of compression fracture of vertebral column      Family History  Problem Relation Age of Onset  . Arthritis Mother   . Migraines Mother   . Diabetes Mother   . Hyperlipidemia Mother   . Heart disease Father   . Cancer Father     liver- died in 68  . Irritable bowel syndrome Brother   . Hyperlipidemia Brother      History  Substance Use Topics  . Smoking status: Never Smoker   . Smokeless tobacco: Not on file  . Alcohol Use: No     Objective: Filed Vitals:   12/01/12 0915  BP: 112/56  Pulse: 72  Temp: 98.3 F (36.8 C)    General: Alert and Oriented, No Acute Distress HEENT: Pupils equal, round, reactive to light. Conjunctivae clear.  External ears unremarkable, canals clear with intact TMs with appropriate landmarks.  Middle ear appears open without effusion. Boggy and erythematous inferior turbinates with moderate mucoid discharge.  Moist mucous membranes, pharynx without inflammation nor  lesions there is moderate cobblestoning.  Neck supple without palpable lymphadenopathy nor abnormal masses. Maxillary sinus tenderness to percussion bilaterally Lungs: Clear to auscultation bilaterally, no wheezing/ronchi/rales.  Comfortable work of breathing. Good air movement. Cardiac: Regular rate and rhythm. Normal S1/S2.  No murmurs, rubs, nor gallops.   Extremities: No peripheral edema.  Strong peripheral pulses.  Mental Status: No depression, anxiety, nor agitation. Skin: Warm and dry.  Assessment & Plan: Marissah was seen today for cough.  Diagnoses and associated orders for this visit:  Bacterial sinusitis - doxycycline (VIBRA-TABS) 100 MG tablet; One by mouth twice a day for ten days.  Cough - HYDROcodone-homatropine (HYCODAN) 5-1.5 MG/5ML syrup; Take 5 mLs by mouth every 8 (eight) hours as needed for cough.    discussed the patient I believe her issues stem from uncontrolled bacterial sinusitis therefore start doxycycline can use Hycodan only as needed for cough, encourage her to start nasal saline washes up to 4 times a day. Consider Alka-Seltzer cold and sinus.   Return if symptoms worsen or fail to improve.

## 2012-12-03 ENCOUNTER — Encounter: Payer: Self-pay | Admitting: Sports Medicine

## 2012-12-03 ENCOUNTER — Ambulatory Visit (INDEPENDENT_AMBULATORY_CARE_PROVIDER_SITE_OTHER): Payer: Medicare Other | Admitting: Sports Medicine

## 2012-12-03 VITALS — BP 119/50 | HR 69 | Wt 213.0 lb

## 2012-12-03 DIAGNOSIS — IMO0002 Reserved for concepts with insufficient information to code with codable children: Secondary | ICD-10-CM

## 2012-12-03 DIAGNOSIS — M170 Bilateral primary osteoarthritis of knee: Secondary | ICD-10-CM

## 2012-12-03 DIAGNOSIS — M1710 Unilateral primary osteoarthritis, unspecified knee: Secondary | ICD-10-CM

## 2012-12-03 NOTE — Progress Notes (Signed)
   Procedure: Real-time Ultrasound Guided Injection of right suprapatellar recess Device: GE Logiq E  Verbal informed consent obtained.  Time-out conducted.  Noted no overlying erythema, induration, or other signs of local infection.  Skin prepped in a sterile fashion.  Local anesthesia: Topical Ethyl chloride.  With sterile technique and under real time ultrasound guidance:  25 mg/2.5 mL of Supartz (sodium hyaluronate) in a prefilled syringe was injected easily into the knee through a 22-gauge needle. Completed without difficulty  Pain immediately resolved suggesting accurate placement of the medication.  Advised to call if fevers/chills, erythema, induration, drainage, or persistent bleeding.  Images permanently stored and available for review in the ultrasound unit.  Impression: Technically successful ultrasound guided injection.   

## 2012-12-03 NOTE — Assessment & Plan Note (Signed)
Left knee continues to be pain free. Supartz injection #4 of 5 given today into right knee. We are going to be doing this on a more accelerated schedule, she'll come back soon for number 5 of 5.

## 2012-12-08 ENCOUNTER — Ambulatory Visit (INDEPENDENT_AMBULATORY_CARE_PROVIDER_SITE_OTHER): Payer: Medicare Other | Admitting: Sports Medicine

## 2012-12-08 ENCOUNTER — Ambulatory Visit: Payer: Medicare Other | Admitting: *Deleted

## 2012-12-08 ENCOUNTER — Encounter: Payer: Self-pay | Admitting: Sports Medicine

## 2012-12-08 VITALS — BP 117/54 | HR 66 | Wt 214.0 lb

## 2012-12-08 DIAGNOSIS — E538 Deficiency of other specified B group vitamins: Secondary | ICD-10-CM

## 2012-12-08 DIAGNOSIS — M170 Bilateral primary osteoarthritis of knee: Secondary | ICD-10-CM

## 2012-12-08 DIAGNOSIS — IMO0002 Reserved for concepts with insufficient information to code with codable children: Secondary | ICD-10-CM

## 2012-12-08 DIAGNOSIS — M1710 Unilateral primary osteoarthritis, unspecified knee: Secondary | ICD-10-CM

## 2012-12-08 MED ORDER — CYANOCOBALAMIN 1000 MCG/ML IJ SOLN
1000.0000 ug | Freq: Once | INTRAMUSCULAR | Status: AC
  Administered 2012-12-08: 1000 ug via INTRAMUSCULAR

## 2012-12-08 NOTE — Progress Notes (Signed)
   Procedure: Real-time Ultrasound Guided Injection of right suprapatellar recess Device: GE Logiq E  Verbal informed consent obtained.  Time-out conducted.  Noted no overlying erythema, induration, or other signs of local infection.  Skin prepped in a sterile fashion.  Local anesthesia: Topical Ethyl chloride.  With sterile technique and under real time ultrasound guidance:  25 mg/2.5 mL of Supartz (sodium hyaluronate) in a prefilled syringe was injected easily into the knee through a 22-gauge needle. Completed without difficulty  Pain immediately resolved suggesting accurate placement of the medication.  Advised to call if fevers/chills, erythema, induration, drainage, or persistent bleeding.  Images permanently stored and available for review in the ultrasound unit.  Impression: Technically successful ultrasound guided injection.   

## 2012-12-08 NOTE — Addendum Note (Signed)
Addended by: Pixie Casino on: 12/08/2012 10:23 AM   Modules accepted: Orders

## 2012-12-08 NOTE — Assessment & Plan Note (Signed)
Supartz series finished. Overall doing significantly better. Return on an as-needed basis.

## 2013-01-08 ENCOUNTER — Ambulatory Visit (INDEPENDENT_AMBULATORY_CARE_PROVIDER_SITE_OTHER): Payer: Medicare Other | Admitting: Family Medicine

## 2013-01-08 ENCOUNTER — Encounter: Payer: Self-pay | Admitting: *Deleted

## 2013-01-08 VITALS — BP 125/52 | HR 84

## 2013-01-08 DIAGNOSIS — D510 Vitamin B12 deficiency anemia due to intrinsic factor deficiency: Secondary | ICD-10-CM

## 2013-01-08 MED ORDER — CYANOCOBALAMIN 1000 MCG/ML IJ SOLN
1000.0000 ug | Freq: Once | INTRAMUSCULAR | Status: AC
  Administered 2013-01-08: 1000 ug via INTRAMUSCULAR

## 2013-01-08 NOTE — Progress Notes (Signed)
  Subjective:    Patient ID: Kristi Bell, female    DOB: 09/24/1950, 62 y.o.   MRN: [id number] B12 injection given IM without complications.  Doron Shake,CMA HPI    Review of Systems     Objective:   Physical Exam        Assessment & Plan:

## 2013-01-28 ENCOUNTER — Ambulatory Visit (INDEPENDENT_AMBULATORY_CARE_PROVIDER_SITE_OTHER): Payer: Medicare Other | Admitting: Sports Medicine

## 2013-01-28 ENCOUNTER — Encounter: Payer: Self-pay | Admitting: Sports Medicine

## 2013-01-28 VITALS — BP 112/52 | HR 63 | Wt 214.0 lb

## 2013-01-28 DIAGNOSIS — IMO0002 Reserved for concepts with insufficient information to code with codable children: Secondary | ICD-10-CM

## 2013-01-28 DIAGNOSIS — M1710 Unilateral primary osteoarthritis, unspecified knee: Secondary | ICD-10-CM

## 2013-01-28 DIAGNOSIS — Z23 Encounter for immunization: Secondary | ICD-10-CM

## 2013-01-28 DIAGNOSIS — M170 Bilateral primary osteoarthritis of knee: Secondary | ICD-10-CM

## 2013-01-28 MED ORDER — HYDROCODONE-ACETAMINOPHEN 5-325 MG PO TABS: 1.0000 | ORAL_TABLET | Freq: Four times a day (QID) | ORAL | Status: AC | PRN

## 2013-01-28 MED ORDER — CELECOXIB 200 MG PO CAPS: ORAL_CAPSULE | ORAL | Status: DC

## 2013-01-28 NOTE — Addendum Note (Signed)
Addended by: Willey Blade C on: 01/28/2013 11:45 AM   Modules accepted: Orders

## 2013-01-28 NOTE — Progress Notes (Signed)
  Subjective:    CC: Follow up  HPI: Knee osteoarthritis: Right worse than left, status post left femoral ORIF in the distant past. She's had several steroid injections, and recently finished a Supartz series approximately 2 months ago. Overall she had a relatively good response but still has some pain she localizes along the medial joint line, without mechanical symptoms. Symptoms are moderate, persistent. She and her husband are going to live in [place] through the winter for the next 8 months. She has a rheumatologist down there that can take over care of her arthritis.  Past medical history, Surgical history, Family history not pertinant except as noted below, Social history, Allergies, and medications have been entered into the medical record, reviewed, and no changes needed.   Review of Systems: No fevers, chills, night sweats, weight loss, chest pain, or shortness of breath.   Objective:    General: Well Developed, well nourished, and in no acute distress.  Neuro: Alert and oriented x3, extra-ocular muscles intact, sensation grossly intact.  HEENT: Normocephalic, atraumatic, pupils equal round reactive to light, neck supple, no masses, no lymphadenopathy, thyroid nonpalpable.  Skin: Warm and dry, no rashes. Cardiac: Regular rate and rhythm, no murmurs rubs or gallops, no lower extremity edema.  Respiratory: Clear to auscultation bilaterally. Not using accessory muscles, speaking in full sentences.  Procedure:  Injection of right knee Consent obtained and verified. Time-out conducted. Noted no overlying erythema, induration, or other signs of local infection. Skin prepped in a sterile fashion. Topical analgesic spray: Ethyl chloride. Completed without difficulty. Meds: 2 cc Kenalog 40, 4 cc lidocaine injected directly into the joint just inferior and medial to the inferior pole of the patella. Pain immediately improved suggesting accurate placement of the medication. Advised to  call if fevers/chills, erythema, induration, drainage, or persistent bleeding.  Impression and Recommendations:

## 2013-01-28 NOTE — Assessment & Plan Note (Signed)
Right worse than left and status post distal femoral ORIF. She is bone on bone in the medial compartment. She is only slightly better after entire Supartz series. Repeat injection today. They're leaving for [place] on Friday for 8 months. I do think that she will ultimately need total knee arthroplasty, unfortunately her distal femoral ORIF device will be in the way, and will need to be removed first. We can revisit this on a future date, likely when she comes back from [place]. Celebrex, Vicodin in the meantime.

## 2013-11-17 ENCOUNTER — Ambulatory Visit: Payer: Medicare Other | Admitting: Sports Medicine

## 2013-11-19 ENCOUNTER — Ambulatory Visit (INDEPENDENT_AMBULATORY_CARE_PROVIDER_SITE_OTHER): Payer: Medicare Other | Admitting: Family Medicine

## 2013-11-19 ENCOUNTER — Telehealth: Payer: Self-pay

## 2013-11-19 VITALS — BP 118/78 | HR 82 | Temp 98.3°F | Ht 62.0 in | Wt 214.0 lb

## 2013-11-19 DIAGNOSIS — E538 Deficiency of other specified B group vitamins: Secondary | ICD-10-CM

## 2013-11-19 DIAGNOSIS — D510 Vitamin B12 deficiency anemia due to intrinsic factor deficiency: Secondary | ICD-10-CM

## 2013-11-19 MED ORDER — CYANOCOBALAMIN 1000 MCG/ML IJ SOLN: 1000.0000 ug | Freq: Once | INTRAMUSCULAR | Status: DC

## 2013-11-19 MED ORDER — CYANOCOBALAMIN 1000 MCG/ML IJ SOLN
1000.0000 ug | Freq: Once | INTRAMUSCULAR | Status: AC
  Administered 2013-11-19: 1000 ug via INTRAMUSCULAR

## 2013-11-19 NOTE — Telephone Encounter (Signed)
Mrs. Kristi LainFulton came in today to get her B12 injection and had questions about the Supartz injections. She wants to know if it can be given in both knees at the same time and and how far apart the injections are given. They are here for about 10 weeks. Please advise./ Kristi Bell,CMA

## 2013-11-19 NOTE — Progress Notes (Signed)
   Subjective:    Patient ID: Kristi LowesAnn M Wismer, female    DOB: [birth year], 63 y.o.   MRN: [id number]  HPI    Review of Systems     Objective:   Physical Exam        Assessment & Plan:  Pt came in today for her B12 injection and tolerated well. Was given in her right arm SQ./Manda Holstad,CMA

## 2013-11-19 NOTE — Telephone Encounter (Signed)
Yes she can however we are switching to Orthovisc, only 4 shots instead of 5.  Please get a box of 8 ordered for both her knees.

## 2013-11-19 NOTE — Telephone Encounter (Signed)
Called pt and this is something that she wants to do. She stated that she will be back in town next week and I informed her that we will check and see if her insurance will cover it and I will be in touch will her.

## 2013-12-03 ENCOUNTER — Ambulatory Visit (INDEPENDENT_AMBULATORY_CARE_PROVIDER_SITE_OTHER): Payer: Medicare Other | Admitting: Sports Medicine

## 2013-12-03 ENCOUNTER — Encounter: Payer: Self-pay | Admitting: Sports Medicine

## 2013-12-03 VITALS — Ht 62.0 in | Wt 212.0 lb

## 2013-12-03 DIAGNOSIS — M170 Bilateral primary osteoarthritis of knee: Secondary | ICD-10-CM

## 2013-12-03 DIAGNOSIS — M1710 Unilateral primary osteoarthritis, unspecified knee: Secondary | ICD-10-CM

## 2013-12-03 NOTE — Assessment & Plan Note (Signed)
OrthoVisc injection #1 of 4 placed and both knees. Return in one week for #2 of 4.

## 2013-12-03 NOTE — Progress Notes (Signed)

## 2013-12-10 ENCOUNTER — Encounter: Payer: Self-pay | Admitting: Sports Medicine

## 2013-12-10 ENCOUNTER — Ambulatory Visit (INDEPENDENT_AMBULATORY_CARE_PROVIDER_SITE_OTHER): Payer: Medicare Other | Admitting: Sports Medicine

## 2013-12-10 VITALS — BP 120/60 | HR 56 | Wt 218.0 lb

## 2013-12-10 DIAGNOSIS — M1710 Unilateral primary osteoarthritis, unspecified knee: Secondary | ICD-10-CM

## 2013-12-10 DIAGNOSIS — M170 Bilateral primary osteoarthritis of knee: Secondary | ICD-10-CM

## 2013-12-10 NOTE — Progress Notes (Signed)
  Procedure: Real-time Ultrasound Guided Injection of left knee Device: GE Logiq E  Verbal informed consent obtained.  Time-out conducted.  Noted no overlying erythema, induration, or other signs of local infection.  Skin prepped in a sterile fashion.  Local anesthesia: Topical Ethyl chloride.  With sterile technique and under real time ultrasound guidance:   30 mg/2 mL of OrthoVisc (sodium hyaluronate) in a prefilled syringe was injected easily into the knee through a 22-gauge needle. Completed without difficulty  Pain immediately resolved suggesting accurate placement of the medication.  Advised to call if fevers/chills, erythema, induration, drainage, or persistent bleeding.  Images permanently stored and available for review in the ultrasound unit.  Impression: Technically successful ultrasound guided injection.  Procedure: Real-time Ultrasound Guided Injection of right knee Device: GE Logiq E  Verbal informed consent obtained.  Time-out conducted.  Noted no overlying erythema, induration, or other signs of local infection.  Skin prepped in a sterile fashion.  Local anesthesia: Topical Ethyl chloride.  With sterile technique and under real time ultrasound guidance:   25 cc of straw-colored fluid aspirated, syringe switched and 30 mg/2 mL of OrthoVisc (sodium hyaluronate) in a prefilled syringe was injected easily into the knee through a 22-gauge needle. Completed without difficulty  Pain immediately resolved suggesting accurate placement of the medication.  Advised to call if fevers/chills, erythema, induration, drainage, or persistent bleeding.  Images permanently stored and available for review in the ultrasound unit.  Impression: Technically successful ultrasound guided injection.

## 2013-12-10 NOTE — Assessment & Plan Note (Signed)
OrthoVisc injection #2 into both knees. Return in one week for injection #3 of 4 into both knees.

## 2013-12-17 ENCOUNTER — Ambulatory Visit (INDEPENDENT_AMBULATORY_CARE_PROVIDER_SITE_OTHER): Payer: Medicare Other | Admitting: Sports Medicine

## 2013-12-17 VITALS — BP 92/54 | HR 82 | Ht 62.5 in | Wt 215.0 lb

## 2013-12-17 DIAGNOSIS — M1710 Unilateral primary osteoarthritis, unspecified knee: Secondary | ICD-10-CM

## 2013-12-17 DIAGNOSIS — M170 Bilateral primary osteoarthritis of knee: Secondary | ICD-10-CM

## 2013-12-17 NOTE — Assessment & Plan Note (Signed)
Starting to feel a little better. OrthoVisc injection #3 of 4 into both knees. Return in one week for injection #4.

## 2013-12-17 NOTE — Progress Notes (Signed)

## 2013-12-21 ENCOUNTER — Ambulatory Visit: Payer: Medicare Other

## 2013-12-24 ENCOUNTER — Encounter: Payer: Self-pay | Admitting: Sports Medicine

## 2013-12-24 ENCOUNTER — Ambulatory Visit (INDEPENDENT_AMBULATORY_CARE_PROVIDER_SITE_OTHER): Payer: Medicare Other | Admitting: Sports Medicine

## 2013-12-24 VITALS — BP 104/58 | HR 70 | Ht 62.5 in | Wt 218.0 lb

## 2013-12-24 DIAGNOSIS — M1710 Unilateral primary osteoarthritis, unspecified knee: Secondary | ICD-10-CM

## 2013-12-24 DIAGNOSIS — E538 Deficiency of other specified B group vitamins: Secondary | ICD-10-CM

## 2013-12-24 DIAGNOSIS — M170 Bilateral primary osteoarthritis of knee: Secondary | ICD-10-CM

## 2013-12-24 MED ORDER — CYANOCOBALAMIN 1000 MCG/ML IJ SOLN
1000.0000 ug | Freq: Once | INTRAMUSCULAR | Status: AC
  Administered 2013-12-24: 1000 ug via INTRAMUSCULAR

## 2013-12-24 NOTE — Progress Notes (Signed)
  Procedure: Real-time Ultrasound Guided Injection of left knee Device: GE Logiq E  Verbal informed consent obtained.  Time-out conducted.  Noted no overlying erythema, induration, or other signs of local infection.  Skin prepped in a sterile fashion.  Local anesthesia: Topical Ethyl chloride.  With sterile technique and under real time ultrasound guidance:   30 mg/2 mL of OrthoVisc (sodium hyaluronate) in a prefilled syringe was injected easily into the knee through a 22-gauge needle. Completed without difficulty  Pain immediately resolved suggesting accurate placement of the medication.  Advised to call if fevers/chills, erythema, induration, drainage, or persistent bleeding.  Images permanently stored and available for review in the ultrasound unit.  Impression: Technically successful ultrasound guided injection.  Procedure: Real-time Ultrasound Guided Injection of right knee Device: GE Logiq E  Verbal informed consent obtained.  Time-out conducted.  Noted no overlying erythema, induration, or other signs of local infection.  Skin prepped in a sterile fashion.  Local anesthesia: Topical Ethyl chloride.  With sterile technique and under real time ultrasound guidance:   Aspirated 25 cc of straw-colored fluid, syringe switched and 30 mg/2 mL of OrthoVisc (sodium hyaluronate) in a prefilled syringe was injected easily into the knee through a 18-gauge needle. Completed without difficulty  Pain immediately resolved suggesting accurate placement of the medication.  Advised to call if fevers/chills, erythema, induration, drainage, or persistent bleeding.  Images permanently stored and available for review in the ultrasound unit.  Impression: Technically successful ultrasound guided injection.

## 2013-12-24 NOTE — Assessment & Plan Note (Signed)
OrthoVisc injection # 4 of 4 into both knees, aspiration performed of the right knee, return as needed. Knee brace, home rehabilitation, overall doing well.

## 2014-06-11 ENCOUNTER — Telehealth: Payer: Self-pay

## 2014-06-11 NOTE — Telephone Encounter (Signed)
She can. It is supposed to be every 6 months.  When was her last. I didn't see in her chart.

## 2014-06-11 NOTE — Telephone Encounter (Signed)
Patient called and wanted to know if she could get a Prolia injection in June.

## 2014-06-15 NOTE — Telephone Encounter (Signed)
lvm informing 6mos from last.Kristi Bell, Kristi Bell

## 2014-08-09 LAB — HEPATIC FUNCTION PANEL
ALT: 22 U/L (ref 7–35)
AST: 35 U/L (ref 13–35)
Alkaline Phosphatase: 93 U/L (ref 25–125)
BILIRUBIN, TOTAL: 5 mg/dL

## 2014-08-09 LAB — BASIC METABOLIC PANEL
BUN: 17 mg/dL (ref 4–21)
Creatinine: 0.5 mg/dL (ref 0.5–1.1)
Glucose: 98 mg/dL
POTASSIUM: 4.6 mmol/L (ref 3.4–5.3)
SODIUM: 138 mmol/L (ref 137–147)

## 2014-08-09 LAB — CBC AND DIFFERENTIAL
Hemoglobin: 10.9 g/dL — AB (ref 12.0–16.0)
Platelets: 519 10*3/uL — AB (ref 150–399)
WBC: 14.1 10^3/mL

## 2014-08-09 LAB — COMPREHENSIVE METABOLIC PANEL
CHLORIDE: 98 mmol/L
CO2: 26
Calcium, Ser: 9.8

## 2014-08-09 LAB — THYROXINE (T4) FREE, DIRECT: Free T4: 1.6

## 2014-08-09 LAB — TSH: TSH: 1.44 u[IU]/mL (ref 0.41–5.90)

## 2014-12-22 ENCOUNTER — Encounter: Payer: Self-pay | Admitting: Family Medicine

## 2014-12-22 ENCOUNTER — Ambulatory Visit (INDEPENDENT_AMBULATORY_CARE_PROVIDER_SITE_OTHER): Payer: Medicare Other | Admitting: Family Medicine

## 2014-12-22 VITALS — BP 126/72 | HR 76 | Temp 98.4°F | Wt 220.0 lb

## 2014-12-22 DIAGNOSIS — Z1211 Encounter for screening for malignant neoplasm of colon: Secondary | ICD-10-CM

## 2014-12-22 DIAGNOSIS — E038 Other specified hypothyroidism: Secondary | ICD-10-CM

## 2014-12-22 DIAGNOSIS — E538 Deficiency of other specified B group vitamins: Secondary | ICD-10-CM | POA: Diagnosis not present

## 2014-12-22 DIAGNOSIS — R7301 Impaired fasting glucose: Secondary | ICD-10-CM

## 2014-12-22 LAB — POCT GLYCOSYLATED HEMOGLOBIN (HGB A1C): HEMOGLOBIN A1C: 4.5

## 2014-12-22 MED ORDER — CYANOCOBALAMIN 1000 MCG/ML IJ SOLN
1000.0000 ug | Freq: Once | INTRAMUSCULAR | Status: AC
  Administered 2014-12-22: 1000 ug via INTRAMUSCULAR

## 2014-12-22 NOTE — Addendum Note (Signed)
Addended by: Collie Siad on: 12/22/2014 02:17 PM   Modules accepted: Orders

## 2014-12-22 NOTE — Progress Notes (Signed)
   Subjective:    Patient ID: Kristi Bell, female    DOB: 1950/11/07, 64 y.o.   MRN: [id number]  HPI She lives in [place] most of the year.  Comes her once a year. Marland Kitchen  Hx of B12 def, IFG, but thyroid is some, and migraines.  IFG - she has not ben as active as ha had problems with her sciatic nerve.  She is also continued to have some knee pain and underwent Orthovisc injections and did well with that. She still has some discomfort but has been able to get along. They're thinking about doing the replacement on her left knee. She has some hardware in her right leg and is not going to have any type of revision done on her right knee.  B12 deficiency-this is typically followed by her hematologist who also follows her for hereditary spherocytosis. Last hemoglobin level looks stable around 10.  Review of Systems     Objective:   Physical Exam  Constitutional: She is oriented to person, place, and time. She appears well-developed and well-nourished.  HENT:  Head: Normocephalic and atraumatic.  +jaundice.   Eyes: Conjunctivae and EOM are normal.  Cardiovascular: Normal rate, regular rhythm and normal heart sounds.   Pulmonary/Chest: Effort normal and breath sounds normal.  Neurological: She is alert and oriented to person, place, and time.  Skin: Skin is warm and dry. No pallor.  Psychiatric: She has a normal mood and affect. Her behavior is normal.          Assessment & Plan:  Pernicious anemia - given b12 injection today. Continue monthly injections. I do not have a recent B12 serum level but she says that her hematologist in [place] follows this for her.  Impaired fasting glucose-we'll check A1c today. He A1C is 4.5, well controlled.   Hypothyroidism-she brought in some blood work done at [facility] from March 2016. Her thyroid levels look great.  Hereditary spherocytosis-hemoglobin stable. Will get recent labs entered into our system.  Due for colon  cancer screening. Referral placed for gastroenterology. Patient prefers [name].  Did encourage her to get her flu shot this fall.

## 2015-01-11 ENCOUNTER — Ambulatory Visit (INDEPENDENT_AMBULATORY_CARE_PROVIDER_SITE_OTHER): Payer: Medicare Other | Admitting: Family Medicine

## 2015-01-11 DIAGNOSIS — Z23 Encounter for immunization: Secondary | ICD-10-CM

## 2015-01-11 NOTE — Progress Notes (Signed)
Patient came into clinic today for flu vaccination. Pt was given a flu shot questionnaire prior to administration of immunization. All questions were answered no. Pt tolerated injection of flu immunization in Left deltoid well, with no immediate complications. An information pamphlet regarding flu shot immunization and frequently asked questions was given to Pt prior to leaving clinic. Advised to contact our office with any questions/concerns.

## 2015-01-24 ENCOUNTER — Ambulatory Visit (INDEPENDENT_AMBULATORY_CARE_PROVIDER_SITE_OTHER): Payer: Medicare Other | Admitting: Family Medicine

## 2015-01-24 VITALS — BP 116/53 | HR 73 | Temp 99.0°F | Resp 16 | Wt 217.0 lb

## 2015-01-24 DIAGNOSIS — E538 Deficiency of other specified B group vitamins: Secondary | ICD-10-CM

## 2015-01-24 DIAGNOSIS — D510 Vitamin B12 deficiency anemia due to intrinsic factor deficiency: Secondary | ICD-10-CM

## 2015-01-24 MED ORDER — CYANOCOBALAMIN 1000 MCG/ML IJ SOLN
1000.0000 ug | Freq: Once | INTRAMUSCULAR | Status: AC
  Administered 2015-01-24: 1000 ug via INTRAMUSCULAR

## 2015-01-24 MED ORDER — CYANOCOBALAMIN 1000 MCG/ML IJ SOLN: 1000.0000 ug | Freq: Once | INTRAMUSCULAR | Status: DC

## 2015-01-24 NOTE — Progress Notes (Signed)
   Subjective:    Patient ID: Kristi Bell, female    DOB: November 01, 1950, 64 y.o.   MRN: [id number]  HPIPatient is here for VitB12 injection. Denies gastrointestinal problems or dizziness.    Review of Systems     Objective:   Physical Exam        Assessment & Plan:  Tolerated injection well without complications. Patient is going to [place] and will received injections there for next few months.

## 2015-02-11 ENCOUNTER — Encounter: Payer: Self-pay | Admitting: Family Medicine

## 2015-11-16 ENCOUNTER — Ambulatory Visit (INDEPENDENT_AMBULATORY_CARE_PROVIDER_SITE_OTHER): Payer: Medicare Other | Admitting: Family Medicine

## 2015-11-16 ENCOUNTER — Telehealth: Payer: Self-pay

## 2015-11-16 VITALS — BP 120/56 | HR 91

## 2015-11-16 DIAGNOSIS — D510 Vitamin B12 deficiency anemia due to intrinsic factor deficiency: Secondary | ICD-10-CM | POA: Diagnosis not present

## 2015-11-16 MED ORDER — CYANOCOBALAMIN 1000 MCG/ML IJ SOLN
1000.0000 ug | Freq: Once | INTRAMUSCULAR | Status: AC
  Administered 2015-11-16: 1000 ug via INTRAMUSCULAR

## 2015-11-16 NOTE — Progress Notes (Signed)
   Subjective:    Patient ID: Fran LowesAnn M Villacis, female    DOB: [birth year], 65 y.o.   MRN: [id number]  HPI Pt here for a B12 injection.  Denies GI problems or dizziness.   Review of Systems     Objective:   Physical Exam        Assessment & Plan:  Pt tolerated injection well and without complications in right deltoid.  Pt advised to schedule appointment in 30 days.   Agree with above.  Nani Gasseratherine Metheney, MD

## 2015-11-21 LAB — COMPLETE METABOLIC PANEL WITH GFR
ALT: 20 U/L (ref 6–29)
AST: 43 U/L — ABNORMAL HIGH (ref 10–35)
Albumin: 3.8 g/dL (ref 3.6–5.1)
Alkaline Phosphatase: 76 U/L (ref 33–130)
BUN: 20 mg/dL (ref 7–25)
CALCIUM: 9.2 mg/dL (ref 8.6–10.4)
CHLORIDE: 104 mmol/L (ref 98–110)
CO2: 26 mmol/L (ref 20–31)
Creat: 0.8 mg/dL (ref 0.50–0.99)
GFR, EST NON AFRICAN AMERICAN: 78 mL/min (ref >=60)
Glucose, Bld: 108 mg/dL — ABNORMAL HIGH (ref 65–99)
POTASSIUM: 4.6 mmol/L (ref 3.5–5.3)
Sodium: 135 mmol/L (ref 135–146)
Total Bilirubin: 5.1 mg/dL — ABNORMAL HIGH (ref 0.2–1.2)
Total Protein: 6.9 g/dL (ref 6.1–8.1)

## 2015-11-24 ENCOUNTER — Ambulatory Visit (INDEPENDENT_AMBULATORY_CARE_PROVIDER_SITE_OTHER): Payer: Medicare Other | Admitting: Family Medicine

## 2015-11-24 ENCOUNTER — Ambulatory Visit (INDEPENDENT_AMBULATORY_CARE_PROVIDER_SITE_OTHER): Payer: Medicare Other

## 2015-11-24 ENCOUNTER — Encounter: Payer: Self-pay | Admitting: Family Medicine

## 2015-11-24 VITALS — BP 133/81 | HR 85 | Wt 219.0 lb

## 2015-11-24 DIAGNOSIS — S99922A Unspecified injury of left foot, initial encounter: Secondary | ICD-10-CM

## 2015-11-24 DIAGNOSIS — W2203XA Walked into furniture, initial encounter: Secondary | ICD-10-CM

## 2015-11-24 DIAGNOSIS — S92502A Displaced unspecified fracture of left lesser toe(s), initial encounter for closed fracture: Secondary | ICD-10-CM | POA: Diagnosis not present

## 2015-11-24 DIAGNOSIS — S92512A Displaced fracture of proximal phalanx of left lesser toe(s), initial encounter for closed fracture: Secondary | ICD-10-CM

## 2015-11-24 NOTE — Progress Notes (Signed)
Kristi Bell is a 65 y.o. female who presents to [facility] today for left foot injury. Patient accidentally stubbed her foot and toes on her couch. His ago. She notes pain and swelling especially at the fifth digit. She is able to walk but with a limp. No radiating pain weakness or numbness. No treatment tried yet. No fevers or chills.   Past Medical History  Diagnosis Date  . Recurrent hepatitis (HCC)   . Gallstones   . Femur fracture, right (HCC)   . Sialolithiasis   . Spherocytosis (HCC)     with hemolytic anemia  . Osteoporosis     severe  . History of compression fracture of vertebral column    Past Surgical History  Procedure Laterality Date  . Intraoperative cholangiogram    . Cholecystectomy    . Arm pinned      left  . Elbow pinned      right  . Femur pinned      right  . Hip pinned      right  . Joint replacement      right hip  . Sphincterotomy      for gallstones  . Spleenectomy    . Bilateral duct surgery      12/2006  . Vertebroplasty  07/2007   Social History  Substance Use Topics  . Smoking status: Never Smoker   . Smokeless tobacco: Not on file  . Alcohol Use: No   family history includes Arthritis in her mother; Cancer in her father; Diabetes in her mother; Heart disease in her father; Hyperlipidemia in her brother and mother; Irritable bowel syndrome in her brother; Migraines in her mother.  ROS:  No headache, visual changes, nausea, vomiting, diarrhea, constipation, dizziness, abdominal pain, skin rash, fevers, chills, night sweats, weight loss, swollen lymph nodes, body aches, joint swelling, muscle aches, chest pain, shortness of breath, mood changes, visual or auditory hallucinations.    Medications: Current Outpatient Prescriptions  Medication Sig Dispense Refill  . atenolol (TENORMIN) 50 MG tablet Take 50 mg by mouth daily.    . calcium-vitamin D (OSCAL WITH D) 250-125 MG-UNIT per tablet Take  1 tablet by mouth daily.    . Cholecalciferol (VITAMIN D) 2000 UNITS CAPS Take 1 capsule by mouth daily.    Marland Kitchen denosumab (PROLIA) 60 MG/ML SOLN injection Inject 60 mg into the skin every 6 (six) months. Administer in upper arm, thigh, or abdomen    . diltiazem (CARDIZEM) 30 MG tablet Take 30 mg by mouth 2 (two) times daily.     . folic acid (FOLVITE) 1 MG tablet Take 2 mg by mouth daily.     . furosemide (LASIX) 40 MG tablet Take 40 mg by mouth.    Marland Kitchen HYDROcodone-acetaminophen (NORCO/VICODIN) 5-325 MG per tablet Take 1 tablet by mouth every 6 (six) hours as needed for pain. 90 tablet 0  . levothyroxine (SYNTHROID, LEVOTHROID) 200 MCG tablet Take 200 mcg by mouth daily.    Marland Kitchen warfarin (COUMADIN) 5 MG tablet Take 5 mg by mouth daily.    Marland Kitchen zolpidem (AMBIEN) 5 MG tablet Take 5 mg by mouth at bedtime as needed for sleep. Reported on 11/24/2015     No current facility-administered medications for this visit.   Allergies  Allergen Reactions  . Butamben-Tetracaine-Benzocaine     REACTION: Vomiting  . Sulfonamide Derivatives     REACTION: Adverse effects with anemia     Exam:  BP 133/81 mmHg  Pulse 85  Wt 219 lb (99.338 kg) General: Well Developed, well nourished, and in no acute distress.  Neuro/Psych: Alert and oriented x3, extra-ocular muscles intact, able to move all 4 extremities, sensation grossly intact. Skin: Warm and dry, no rashes noted.  Respiratory: Not using accessory muscles, speaking in full sentences, trachea midline.  Cardiovascular: Pulses palpable, no extremity edema. Abdomen: Does not appear distended. MSK: Left foot: Erythematous and tender fifth toe. Otherwise nontender. Pulses capillary refill sensation intact.  X-ray foot shows a left fifth proximal phalanx fracture Awaiting formal radiology review   No results found for this or any previous visit (from the past 24 hour(s)). No results found.   Assessment and Plan: 65 y.o. female with fifth toe fracture of the  left foot. Plan for buddy taping postoperative shoe and Tylenol for pain control. Recheck in 2-3 weeks.

## 2015-11-24 NOTE — Patient Instructions (Signed)
Thank you for coming in today. Use buddy tape.  Use the foot shoe as needed.  Return in 3 weeks with me on a Monday or Thursday ideally.  Tylenol for pain is just fine.   Buddy Taping You have a minor finger or toe injury. It can be managed by buddy taping. Buddy taping means the injured finger or toe is taped to a healthy uninjured adjacent finger or toe. Most minor fractures and dislocations of the smaller fingers and toes will heal in 3 to 4 weeks. Buddy taping immobilizes and protects the area of injury. Buddy taping is not recommended for initial treatment of fractures of the thumb, longer fingers, or the great toe. Buddy taping should not be used for unstable or deformed fractures, but as fracture healing progresses it may be used for protection during rehabilitation. Fractured fingers and toes should be protected by buddy taping as long as the injury is still painful or swollen.  When an injury is buddy taped, place a small piece of gauze or cotton between the digits that are taped. This helps prevent the skin from breaking down from increased moisture. Buddy taping allows you to get your injury wet when you bathe. Change the gauze and tape more often if it gets wet, and dry the space between the finger or toes. Use a sturdy, hard-soled shoe for better support if you have a fractured toe. In 2 to 3 weeks you can start motion exercises. This will keep the fingers or toes from becoming stiff.  SEEK IMMEDIATE MEDICAL CARE IF:   The injured area becomes cold, numb, or pale.  You have pain not controlled with medications.  You notice increasing deformity of the toe or finger.   This information is not intended to replace advice given to you by your health care provider. Make sure you discuss any questions you have with your health care provider.   Document Released: 06/07/2004 Document Revised: 05/21/2014 Document Reviewed: 09/22/2014 Elsevier Interactive Patient Education [employer]2016 [employer]vier  Inc.

## 2015-11-28 ENCOUNTER — Ambulatory Visit (INDEPENDENT_AMBULATORY_CARE_PROVIDER_SITE_OTHER): Payer: Medicare Other | Admitting: Family Medicine

## 2015-11-28 VITALS — BP 119/38 | HR 63 | Resp 17 | Wt 223.0 lb

## 2015-11-28 DIAGNOSIS — M810 Age-related osteoporosis without current pathological fracture: Secondary | ICD-10-CM | POA: Diagnosis not present

## 2015-11-28 DIAGNOSIS — Z79899 Other long term (current) drug therapy: Secondary | ICD-10-CM

## 2015-11-28 MED ORDER — DENOSUMAB 60 MG/ML ~~LOC~~ SOLN
60.0000 mg | Freq: Once | SUBCUTANEOUS | Status: AC
  Administered 2015-11-28: 60 mg via SUBCUTANEOUS

## 2015-11-28 NOTE — Progress Notes (Signed)
Kristi Bell is here for a prolia injection for osteoporosis.  Osteoporosis-  Prolia injection was tolerated well without any complications. Her last CMP was on 11/21/2015 and the calcium level and kidney function was normal.     Agree with above. Due for prolia in 6 months.  Nani Gasseratherine Metheney, MD

## 2015-11-29 ENCOUNTER — Ambulatory Visit: Payer: Medicare Other | Admitting: Sports Medicine

## 2015-12-01 ENCOUNTER — Encounter: Payer: Self-pay | Admitting: Sports Medicine

## 2015-12-01 ENCOUNTER — Ambulatory Visit (INDEPENDENT_AMBULATORY_CARE_PROVIDER_SITE_OTHER): Payer: Medicare Other | Admitting: Sports Medicine

## 2015-12-01 VITALS — BP 118/70 | HR 103 | Resp 18 | Wt 220.3 lb

## 2015-12-01 DIAGNOSIS — M170 Bilateral primary osteoarthritis of knee: Secondary | ICD-10-CM

## 2015-12-01 NOTE — Assessment & Plan Note (Signed)
Steroid and Orthovisc injection #1 into both knees, return in one week for #2 of 4.

## 2015-12-01 NOTE — Progress Notes (Signed)
  Subjective:    CC: Bilateral knee pain  HPI: Primary osteoarthritis: Good response to previous series of Orthovisc, desires repeat injections and to restart the series today. Pain is moderate, persistent, localized in both knees at the joint lines, no radiation. No mechanical symptoms, no constitutional symptoms.  Past medical history, Surgical history, Family history not pertinant except as noted below, Social history, Allergies, and medications have been entered into the medical record, reviewed, and no changes needed.   Review of Systems: No fevers, chills, night sweats, weight loss, chest pain, or shortness of breath.   Objective:    General: Well Developed, well nourished, and in no acute distress.  Neuro: Alert and oriented x3, extra-ocular muscles intact, sensation grossly intact.  HEENT: Normocephalic, atraumatic, pupils equal round reactive to light, neck supple, no masses, no lymphadenopathy, thyroid nonpalpable.  Skin: Warm and dry, no rashes. Cardiac: Regular rate and rhythm, no murmurs rubs or gallops, no lower extremity edema.  Respiratory: Clear to auscultation bilaterally. Not using accessory muscles, speaking in full sentences. Bilateral knees: Normal to inspection with no erythema or effusion or obvious bony abnormalities. Tender to palpation at the joint lines bilaterally. ROM normal in flexion and extension and lower leg rotation. Ligaments with solid consistent endpoints including ACL, PCL, LCL, MCL. Negative Mcmurray's and provocative meniscal tests. Non painful patellar compression. Patellar and quadriceps tendons unremarkable. Hamstring and quadriceps strength is normal.  Procedure: Real-time Ultrasound Guided Injection of right knee Device: GE Logiq E  Verbal informed consent obtained.  Time-out conducted.  Noted no overlying erythema, induration, or other signs of local infection.  Skin prepped in a sterile fashion.  Local anesthesia: Topical Ethyl  chloride.  With sterile technique and under real time ultrasound guidance:  1 mL Kenalog 40, 2 mL lidocaine, 2 mL Marcaine injected into the suprapatellar recess, syringe switched and 30 mg/2 mL of OrthoVisc (sodium hyaluronate) in a prefilled syringe was injected easily into the knee through a 22-gauge needle. Completed without difficulty  Pain immediately resolved suggesting accurate placement of the medication.  Advised to call if fevers/chills, erythema, induration, drainage, or persistent bleeding.  Images permanently stored and available for review in the ultrasound unit.  Impression: Technically successful ultrasound guided injection.  Procedure: Real-time Ultrasound Guided Injection of left knee Device: GE Logiq E  Verbal informed consent obtained.  Time-out conducted.  Noted no overlying erythema, induration, or other signs of local infection.  Skin prepped in a sterile fashion.  Local anesthesia: Topical Ethyl chloride.  With sterile technique and under real time ultrasound guidance:  1 mL Kenalog 40, 2 mL lidocaine, 2 mL Marcaine injected into the suprapatellar recess, syringe switched and 30 mg/2 mL of OrthoVisc (sodium hyaluronate) in a prefilled syringe was injected easily into the knee through a 22-gauge needle. Completed without difficulty  Pain immediately resolved suggesting accurate placement of the medication.  Advised to call if fevers/chills, erythema, induration, drainage, or persistent bleeding.  Images permanently stored and available for review in the ultrasound unit.  Impression: Technically successful ultrasound guided injection.  Impression and Recommendations:

## 2015-12-08 ENCOUNTER — Ambulatory Visit (INDEPENDENT_AMBULATORY_CARE_PROVIDER_SITE_OTHER): Payer: Medicare Other | Admitting: Sports Medicine

## 2015-12-08 ENCOUNTER — Encounter: Payer: Self-pay | Admitting: Sports Medicine

## 2015-12-08 DIAGNOSIS — M170 Bilateral primary osteoarthritis of knee: Secondary | ICD-10-CM

## 2015-12-08 NOTE — Progress Notes (Signed)

## 2015-12-08 NOTE — Assessment & Plan Note (Signed)
Orthovisc injection #2 of 4 and both knees, return in one week for #3.

## 2015-12-14 ENCOUNTER — Ambulatory Visit: Payer: Medicare Other

## 2015-12-15 ENCOUNTER — Encounter: Payer: Self-pay | Admitting: Sports Medicine

## 2015-12-15 ENCOUNTER — Ambulatory Visit: Payer: Medicare Other

## 2015-12-15 ENCOUNTER — Ambulatory Visit (INDEPENDENT_AMBULATORY_CARE_PROVIDER_SITE_OTHER): Payer: Medicare Other

## 2015-12-15 ENCOUNTER — Ambulatory Visit (INDEPENDENT_AMBULATORY_CARE_PROVIDER_SITE_OTHER): Payer: Medicare Other | Admitting: Family Medicine

## 2015-12-15 ENCOUNTER — Ambulatory Visit (INDEPENDENT_AMBULATORY_CARE_PROVIDER_SITE_OTHER): Payer: Medicare Other | Admitting: Sports Medicine

## 2015-12-15 VITALS — BP 136/84 | HR 71 | Wt 219.0 lb

## 2015-12-15 DIAGNOSIS — M170 Bilateral primary osteoarthritis of knee: Secondary | ICD-10-CM

## 2015-12-15 DIAGNOSIS — W2203XD Walked into furniture, subsequent encounter: Secondary | ICD-10-CM

## 2015-12-15 DIAGNOSIS — S92502A Displaced unspecified fracture of left lesser toe(s), initial encounter for closed fracture: Secondary | ICD-10-CM | POA: Diagnosis not present

## 2015-12-15 DIAGNOSIS — E538 Deficiency of other specified B group vitamins: Secondary | ICD-10-CM

## 2015-12-15 DIAGNOSIS — S92512D Displaced fracture of proximal phalanx of left lesser toe(s), subsequent encounter for fracture with routine healing: Secondary | ICD-10-CM

## 2015-12-15 MED ORDER — CYANOCOBALAMIN 1000 MCG/ML IJ SOLN
1000.0000 ug | Freq: Once | INTRAMUSCULAR | Status: AC
  Administered 2015-12-15: 1000 ug via INTRAMUSCULAR

## 2015-12-15 NOTE — Patient Instructions (Signed)
Thank you for coming in today. Get xray.  Continue buddy tape.  Return in 1 month.

## 2015-12-15 NOTE — Progress Notes (Signed)
  Procedure: Real-time Ultrasound Guided Injection of left knee Device: GE Logiq E  Verbal informed consent obtained.  Time-out conducted.  Noted no overlying erythema, induration, or other signs of local infection.  Skin prepped in a sterile fashion.  Local anesthesia: Topical Ethyl chloride.  With sterile technique and under real time ultrasound guidance:   30 mg/2 mL of OrthoVisc (sodium hyaluronate) in a prefilled syringe was injected easily into the knee through a 22-gauge needle. Completed without difficulty  Pain immediately resolved suggesting accurate placement of the medication.  Advised to call if fevers/chills, erythema, induration, drainage, or persistent bleeding.  Images permanently stored and available for review in the ultrasound unit.  Impression: Technically successful ultrasound guided injection.  Procedure: Real-time Ultrasound Guided aspiration/Injection of right knee Device: GE Logiq E  Verbal informed consent obtained.  Time-out conducted.  Noted no overlying erythema, induration, or other signs of local infection.  Skin prepped in a sterile fashion.  Local anesthesia: Topical Ethyl chloride.  With sterile technique and under real time ultrasound guidance:  Aspirated 12 mL straw-colored fluid, syringe switched and 30 mg/2 mL of OrthoVisc (sodium hyaluronate) in a prefilled syringe was injected easily into the knee through a 22-gauge needle. Completed without difficulty  Pain immediately resolved suggesting accurate placement of the medication.  Advised to call if fevers/chills, erythema, induration, drainage, or persistent bleeding.  Images permanently stored and available for review in the ultrasound unit.  Impression: Technically successful ultrasound guided injection.

## 2015-12-15 NOTE — Assessment & Plan Note (Signed)
Orthovisc injection #3 of 4, return in one week for #4 of 4 into both knees.

## 2015-12-15 NOTE — Progress Notes (Signed)
EZRAH RITTENOUR is a 65 y.o. female who presents to [facility] today for follow-up left toe fracture. Patient was seen on July 13 for fracture of her left fifth toe. She was treated with buddy tape and feels well. She notes only minimal pain. No significant swelling. She's feeling much better   Past Medical History:  Diagnosis Date  . Femur fracture, right (HCC)   . Gallstones   . History of compression fracture of vertebral column   . Osteoporosis    severe  . Recurrent hepatitis (HCC)   . Sialolithiasis   . Spherocytosis (HCC)    with hemolytic anemia   Past Surgical History:  Procedure Laterality Date  . arm pinned     left  . bilateral duct surgery     12/2006  . CHOLECYSTECTOMY    . elbow pinned     right  . femur pinned     right  . hip pinned     right  . INTRAOPERATIVE CHOLANGIOGRAM    . JOINT REPLACEMENT     right hip  . SPHINCTEROTOMY     for gallstones  . spleenectomy    . VERTEBROPLASTY  07/2007   Social History  Substance Use Topics  . Smoking status: Never Smoker  . Smokeless tobacco: Not on file  . Alcohol use No   family history includes Arthritis in her mother; Cancer in her father; Diabetes in her mother; Heart disease in her father; Hyperlipidemia in her brother and mother; Irritable bowel syndrome in her brother; Migraines in her mother.  ROS as above:  Medications: Current Outpatient Prescriptions  Medication Sig Dispense Refill  . atenolol (TENORMIN) 50 MG tablet Take 50 mg by mouth daily.    . calcium-vitamin D (OSCAL WITH D) 250-125 MG-UNIT per tablet Take 1 tablet by mouth daily.    . Cholecalciferol (VITAMIN D) 2000 UNITS CAPS Take 1 capsule by mouth daily.    Marland Kitchen denosumab (PROLIA) 60 MG/ML SOLN injection Inject 60 mg into the skin every 6 (six) months. Administer in upper arm, thigh, or abdomen    . diltiazem (CARDIZEM)  30 MG tablet Take 30 mg by mouth 2 (two) times daily.     . folic acid (FOLVITE) 1 MG tablet Take 2 mg by mouth daily.     . furosemide (LASIX) 40 MG tablet Take 40 mg by mouth.    Marland Kitchen HYDROcodone-acetaminophen (NORCO/VICODIN) 5-325 MG per tablet Take 1 tablet by mouth every 6 (six) hours as needed for pain. 90 tablet 0  . levothyroxine (SYNTHROID, LEVOTHROID) 200 MCG tablet Take 200 mcg by mouth daily.    Marland Kitchen warfarin (COUMADIN) 5 MG tablet Take 5 mg by mouth daily.    Marland Kitchen zolpidem (AMBIEN) 5 MG tablet Take 5 mg by mouth at bedtime as needed for sleep. Reported on 11/24/2015     No current facility-administered medications for this visit.    Allergies  Allergen Reactions  . Butamben-Tetracaine-Benzocaine     REACTION: Vomiting  . Sulfonamide Derivatives     REACTION: Adverse effects with anemia     Exam:  BP 136/84   Pulse 71   Wt 219 lb (99.3 kg)   SpO2 98%   BMI 39.42 kg/m   Gen: Well NAD Left fifth toe normal-appearing nontender normal motion  X-ray left fifth toe pending  No results found for this or any previous visit (from the past 24 hour(s)). Dg Toe  5th Left  Result Date: 12/15/2015 CLINICAL DATA:  Fracture of the proximal phalanx of the left fifth toe, followup EXAM: DG TOE 5TH LEFT COMPARISON:  Left fifth toe films of 11/24/2015 FINDINGS: There is some callus formation around the nondisplaced fracture of the mid and proximal aspect of the proximal phalanx of the right fifth toe. The fracture line is somewhat more visible probably due to some bony resorption. IMPRESSION: There is callus formation present around the nondisplaced fracture of the base and mid portion of the proximal phalanx of the left fifth toe. Electronically Signed   By: Dwyane Dee M.D.   On: 12/15/2015 09:41      Assessment and Plan: 65 y.o. female with left fifth toe fracture: Doing well. Plan for continue buddy taping obtain x-ray and follow-up in one month.  Additionally we'll give routine B-12  injection  Orders Placed This Encounter  Procedures  . DG Toe 5th Left    Order Specific Question:   Reason for exam:    Answer:   f/u fx    Order Specific Question:   Preferred imaging location?    Answer:   [facility]    Discussed warning signs or symptoms. Please see discharge instructions. Patient expresses understanding.

## 2015-12-22 ENCOUNTER — Ambulatory Visit (INDEPENDENT_AMBULATORY_CARE_PROVIDER_SITE_OTHER): Payer: Medicare Other | Admitting: Sports Medicine

## 2015-12-22 ENCOUNTER — Encounter: Payer: Self-pay | Admitting: Sports Medicine

## 2015-12-22 DIAGNOSIS — M170 Bilateral primary osteoarthritis of knee: Secondary | ICD-10-CM

## 2015-12-22 NOTE — Progress Notes (Signed)

## 2015-12-22 NOTE — Assessment & Plan Note (Signed)
Orthovisc injection #4 of 4 into both knees, return as needed. 

## 2016-01-17 ENCOUNTER — Encounter: Payer: Self-pay | Admitting: Family Medicine

## 2016-01-17 ENCOUNTER — Ambulatory Visit (INDEPENDENT_AMBULATORY_CARE_PROVIDER_SITE_OTHER): Payer: Medicare Other | Admitting: Family Medicine

## 2016-01-17 ENCOUNTER — Ambulatory Visit: Payer: Medicare Other

## 2016-01-17 VITALS — BP 121/68 | HR 89 | Wt 220.0 lb

## 2016-01-17 DIAGNOSIS — Z23 Encounter for immunization: Secondary | ICD-10-CM

## 2016-01-17 DIAGNOSIS — S92502A Displaced unspecified fracture of left lesser toe(s), initial encounter for closed fracture: Secondary | ICD-10-CM

## 2016-01-17 DIAGNOSIS — D510 Vitamin B12 deficiency anemia due to intrinsic factor deficiency: Secondary | ICD-10-CM

## 2016-01-17 MED ORDER — CYANOCOBALAMIN 1000 MCG/ML IJ SOLN
1000.0000 ug | Freq: Once | INTRAMUSCULAR | Status: AC
  Administered 2016-01-17: 1000 ug via INTRAMUSCULAR

## 2016-01-17 NOTE — Progress Notes (Signed)
Kristi Bell is a 65 y.o. female [facility] today for follow-up left fifth toe fracture. She is doing very well from an initial injury in mid July. She denies any further pain.   Past Medical History:  Diagnosis Date  . Femur fracture, right (HCC)   . Gallstones   . History of compression fracture of vertebral column   . Osteoporosis    severe  . Recurrent hepatitis (HCC)   . Sialolithiasis   . Spherocytosis (HCC)    with hemolytic anemia   Past Surgical History:  Procedure Laterality Date  . arm pinned     left  . bilateral duct surgery     12/2006  . CHOLECYSTECTOMY    . elbow pinned     right  . femur pinned     right  . hip pinned     right  . INTRAOPERATIVE CHOLANGIOGRAM    . JOINT REPLACEMENT     right hip  . SPHINCTEROTOMY     for gallstones  . spleenectomy    . VERTEBROPLASTY  07/2007   Social History  Substance Use Topics  . Smoking status: Never Smoker  . Smokeless tobacco: Not on file  . Alcohol use No   family history includes Arthritis in her mother; Cancer in her father; Diabetes in her mother; Heart disease in her father; Hyperlipidemia in her brother and mother; Irritable bowel syndrome in her brother; Migraines in her mother.  ROS as above:  Medications: Current Outpatient Prescriptions  Medication Sig Dispense Refill  . atenolol (TENORMIN) 50 MG tablet Take 50 mg by mouth daily.    . calcium-vitamin D (OSCAL WITH D) 250-125 MG-UNIT per tablet Take 1 tablet by mouth daily.    . Cholecalciferol (VITAMIN D) 2000 UNITS CAPS Take 1 capsule by mouth daily.    Marland Kitchen. denosumab (PROLIA) 60 MG/ML SOLN injection Inject 60 mg into the skin every 6 (six) months. Administer in upper arm, thigh, or abdomen    . diltiazem (CARDIZEM) 30 MG tablet Take 30 mg by mouth 2 (two) times daily.     . folic acid (FOLVITE) 1 MG tablet  Take 2 mg by mouth daily.     . furosemide (LASIX) 40 MG tablet Take 40 mg by mouth.    Marland Kitchen. HYDROcodone-acetaminophen (NORCO/VICODIN) 5-325 MG per tablet Take 1 tablet by mouth every 6 (six) hours as needed for pain. 90 tablet 0  . levothyroxine (SYNTHROID, LEVOTHROID) 200 MCG tablet Take 200 mcg by mouth daily.    Marland Kitchen. warfarin (COUMADIN) 5 MG tablet Take 5 mg by mouth daily.    Marland Kitchen. zolpidem (AMBIEN) 5 MG tablet Take 5 mg by mouth at bedtime as needed for sleep. Reported on 11/24/2015     No current facility-administered medications for this visit.    Allergies  Allergen Reactions  . Butamben-Tetracaine-Benzocaine     REACTION: Vomiting  . Sulfonamide Derivatives     REACTION: Adverse effects with anemia     Exam:  BP 121/68   Pulse 89   Wt 220 lb (99.8 kg)   SpO2 97%   BMI 39.60 kg/m  Gen: Well NAD Left foot normal-appearing nontender  No results found for this or any previous visit (from the past 24 hour(s)). No results found.    Assessment and Plan: 65 y.o. female with fifth toe fracture. Doing well. Return as needed.   Orders Placed This Encounter  Procedures  . Flu Vaccine QUAD 36+ mos PF IM (Fluarix & Fluzone Quad PF)    Discussed warning signs or symptoms. Please see discharge instructions. Patient expresses understanding.

## 2016-03-06 ENCOUNTER — Telehealth: Payer: Self-pay | Admitting: *Deleted

## 2016-03-06 NOTE — Telephone Encounter (Signed)
Pt called and wanted to know when her last prolia injection was. She was informed that it was 11/28/15 the next inj is due 05/30/16.Loralee PacasBarkley, Zoanne Newill [place]

## 2017-06-12 IMAGING — DX DG FOOT COMPLETE 3+V*L*
3 series · 3 of 3 positions shown · non-contrast
Comparison: None.

CLINICAL DATA: Left fifth toe pain after kicking a couch 4 days
ago. Initial encounter.

EXAM:
LEFT FOOT - COMPLETE 3+ VIEW

[foot ap]
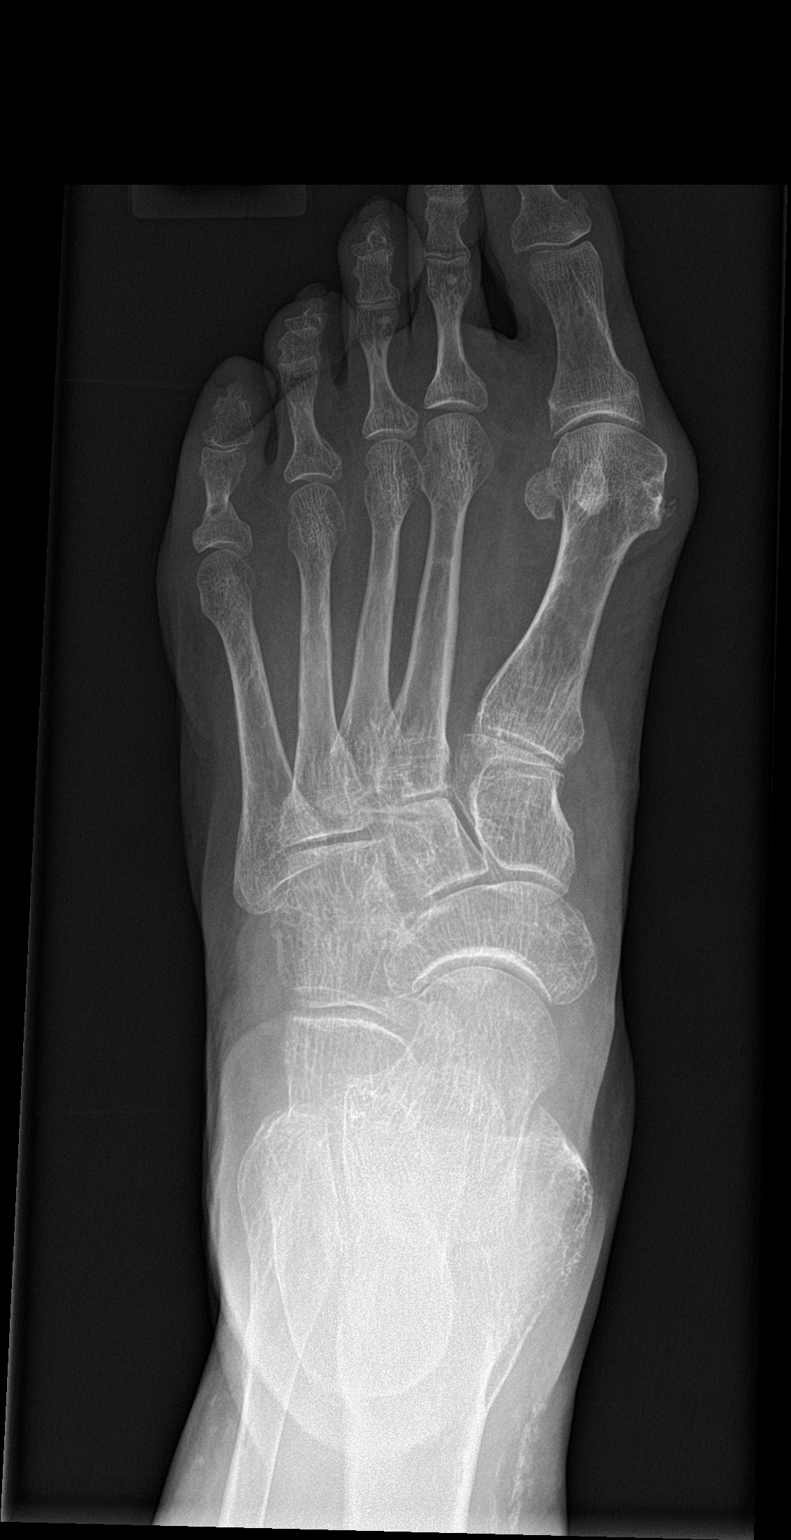

[foot obl]
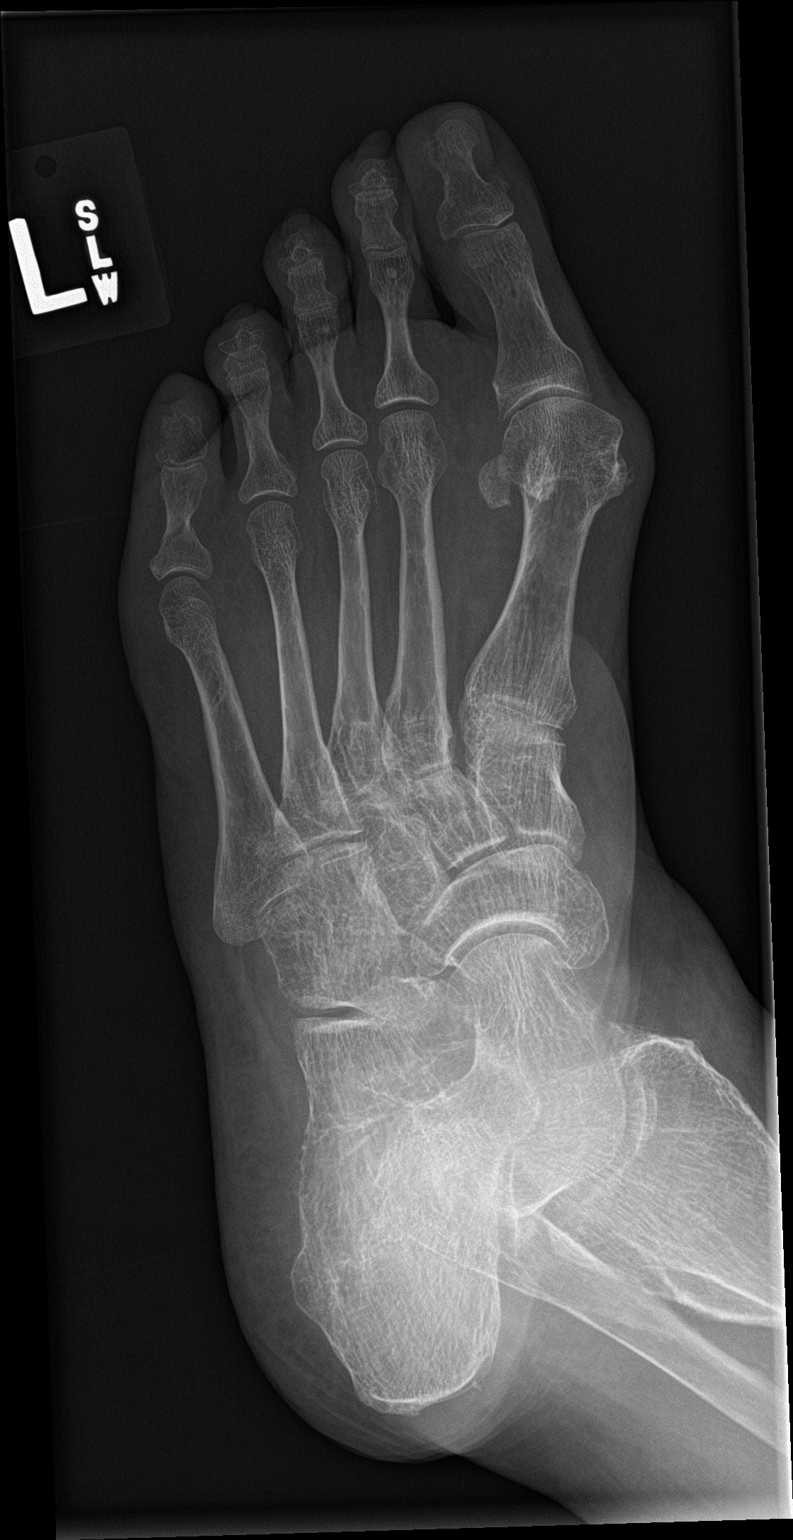

[foot lat]
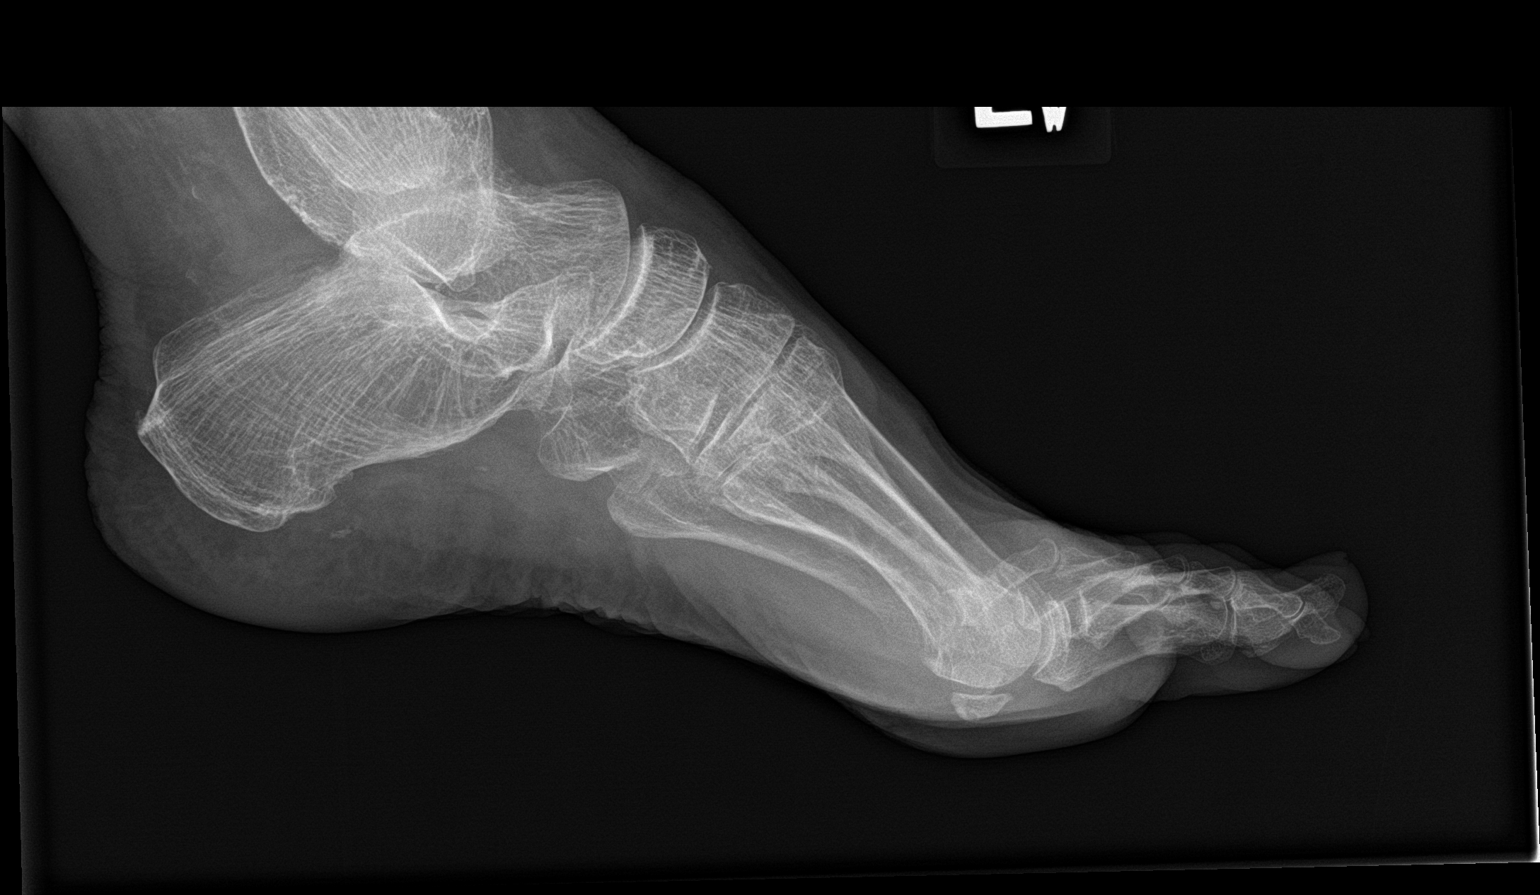

[3 of 3 positions shown; findings below may reference images not displayed]

FINDINGS: Bones are diffusely demineralized. Hallux valgus deformity with mild
degenerative changes MTP joint great toe. Oblique fracture
identified through the shaft of the little toe proximal phalanx.
IMPRESSION: Fracture proximal phalanx little toe.

## 2017-07-03 IMAGING — DX DG TOE 5TH 2+V*L*
3 series · 3 of 3 positions shown · non-contrast
Comparison: Left fifth toe films of 11/24/2015

CLINICAL DATA: Fracture of the proximal phalanx of the left fifth
toe, followup

EXAM:
DG TOE 5TH LEFT

[toe ap]
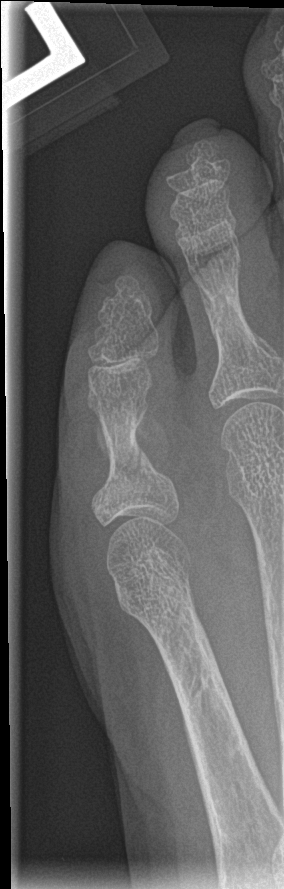

[toe obl]
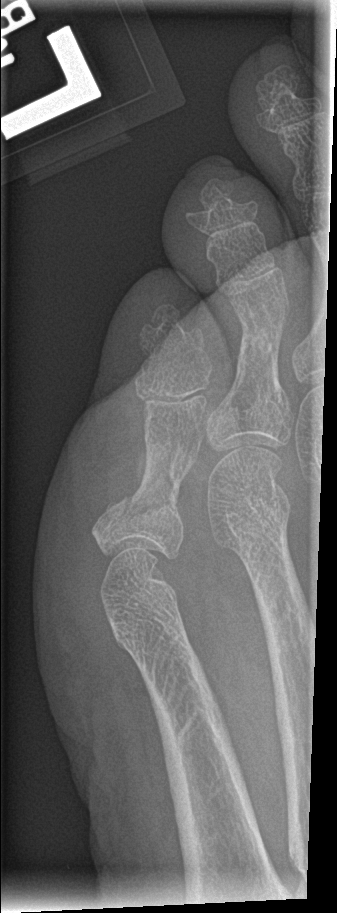

[toe lat]
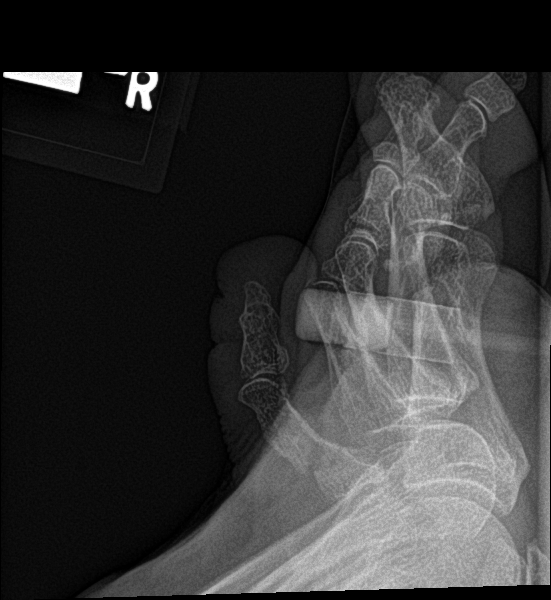

[3 of 3 positions shown; findings below may reference images not displayed]

FINDINGS: There is some callus formation around the nondisplaced fracture of
the mid and proximal aspect of the proximal phalanx of the right
fifth toe. The fracture line is somewhat more visible probably due
to some bony resorption.
IMPRESSION: There is callus formation present around the nondisplaced fracture
of the base and mid portion of the proximal phalanx of the left
fifth toe.

## 2018-06-14 DEATH — deceased
